# Patient Record
Sex: Female | Born: 1980 | Race: Black or African American | Hispanic: Yes | Marital: Married | Smoking: Never smoker
Health system: Southern US, Community
[De-identification: ages and names within clinical notes are randomized; demographics above are authoritative.]

## PROBLEM LIST (undated history)

## (undated) ENCOUNTER — Inpatient Hospital Stay (HOSPITAL_COMMUNITY): Payer: Self-pay

## (undated) DIAGNOSIS — N883 Incompetence of cervix uteri: Secondary | ICD-10-CM

## (undated) HISTORY — PX: NO PAST SURGERIES: SHX2092

---

## 2014-10-09 NOTE — L&D Delivery Note (Signed)
Delivery Note At 6:40 PM a viable female was delivered via ROA Presentation Placenta status:spontaneously with 3 vessel cord, .  Cord:  with the following complications:none .    Anesthesia:  epidural Episiotomy:  none Lacerations:  none Suture Repair: not applicable Est. Blood Loss (mL): 300   Mom to postpartum.  Baby to NICU.  [name] 02/24/2015, 6:49 PM

## 2014-10-26 ENCOUNTER — Inpatient Hospital Stay (HOSPITAL_COMMUNITY)
Admission: AD | Admit: 2014-10-26 | Discharge: 2014-10-26 | Disposition: A | Discharge status: Home / Self Care | Payer: Managed Care, Other (non HMO) | Source: Ambulatory Visit | Attending: Obstetrics and Gynecology | Admitting: Obstetrics and Gynecology

## 2014-10-26 ENCOUNTER — Encounter (HOSPITAL_COMMUNITY): Payer: Self-pay | Admitting: *Deleted

## 2014-10-26 ENCOUNTER — Inpatient Hospital Stay (HOSPITAL_COMMUNITY): Payer: Managed Care, Other (non HMO)

## 2014-10-26 DIAGNOSIS — Z3A12 12 weeks gestation of pregnancy: Secondary | ICD-10-CM | POA: Diagnosis not present

## 2014-10-26 DIAGNOSIS — O209 Hemorrhage in early pregnancy, unspecified: Secondary | ICD-10-CM | POA: Diagnosis present

## 2014-10-26 DIAGNOSIS — O4691 Antepartum hemorrhage, unspecified, first trimester: Secondary | ICD-10-CM

## 2014-10-26 LAB — CBC
HEMATOCRIT: 32 % — AB (ref 36.0–46.0)
HEMOGLOBIN: 10.9 g/dL — AB (ref 12.0–15.0)
MCH: 26.5 pg (ref 26.0–34.0)
MCHC: 34.1 g/dL (ref 30.0–36.0)
MCV: 77.9 fL — ABNORMAL LOW (ref 78.0–100.0)
Platelets: 340 10*3/uL (ref 150–400)
RBC: 4.11 MIL/uL (ref 3.87–5.11)
RDW: 13.8 % (ref 11.5–15.5)
WBC: 10.6 10*3/uL — AB (ref 4.0–10.5)

## 2014-10-26 LAB — URINE MICROSCOPIC-ADD ON

## 2014-10-26 LAB — URINALYSIS, ROUTINE W REFLEX MICROSCOPIC
Bilirubin Urine: NEGATIVE
GLUCOSE, UA: NEGATIVE mg/dL
Ketones, ur: NEGATIVE mg/dL
Leukocytes, UA: NEGATIVE
Nitrite: NEGATIVE
PROTEIN: NEGATIVE mg/dL
Urobilinogen, UA: 0.2 mg/dL (ref 0.0–1.0)
pH: 5.5 (ref 5.0–8.0)

## 2014-10-26 NOTE — MAU Provider Note (Signed)
History     CSN: [id number]  Arrival date and time: 10/26/14 1950   First Provider Initiated Contact with Patient 10/26/14 2037      Chief Complaint  Patient presents with  . Abdominal Pain  . Vaginal Bleeding   HPI  Erika Manning is a 34 y.o. G1P0 a [redacted]w[redacted]d who presents today with vaginal bleeding. She states that around 1730 she had a gush of blood, and some clots. However, the bleeding has stopped now. She states that after the bleeding started she had some cramping, but she is not having any pain at this time. She denies any recent intercourse. She has had one ultrasound in the office very early in pregnancy, and states that she has an appointment for an [place] on 11/05/14.  History reviewed. No pertinent past medical history.  History reviewed. No pertinent past surgical history.  History reviewed. No pertinent family history.  History  Substance Use Topics  . Smoking status: Never Smoker   . Smokeless tobacco: Not on file  . Alcohol Use: No    Allergies: No Known Allergies  Prescriptions prior to admission  Medication Sig Dispense Refill Last Dose  . acetaminophen (TYLENOL) 325 MG tablet Take 650 mg by mouth every 6 (six) hours as needed for headache.   10/26/2014 at Unknown time  . lactase (LACTAID) 3000 UNITS tablet Take 3,000 Units by mouth as needed (Based on meals.).   10/25/2014 at Unknown time  . Prenatal Vit-Fe Fumarate-FA (PRENATAL MULTIVITAMIN) TABS tablet Take 1 tablet by mouth at bedtime.   10/25/2014 at Unknown time  . ranitidine (ZANTAC) 150 MG tablet Take 150 mg by mouth daily.   10/26/2014 at Unknown time    ROS Physical Exam   Blood pressure 129/76, pulse 91, temperature 98.8 F (37.1 C), temperature source Oral, resp. rate 18, height  (1.676 m), weight 113.399 kg (250 lb), SpO2 99 %.  Physical Exam  Nursing note and vitals reviewed. Constitutional: She is oriented to person, place, and time. She appears well-developed and well-nourished. No  distress.  Cardiovascular: Normal rate.   Respiratory: Effort normal.  GI: Soft. There is no tenderness. There is no rebound.  Genitourinary:   External: no lesion Vagina: small amount of blood seen  Cervix: pink, smooth, no CMT Uterus: 11 weeks size, +FHT with doppler    Neurological: She is alert and oriented to person, place, and time.  Skin: Skin is warm and dry.  Psychiatric: She has a normal mood and affect.    MAU Course  Procedures Results for orders placed or performed during the hospital encounter of 10/26/14 (from the past 24 hour(s))  Urinalysis, Routine w reflex microscopic     Status: Abnormal   Collection Time: 10/26/14  8:05 PM  Result Value Ref Range   Color, Urine YELLOW YELLOW   APPearance CLEAR CLEAR   Specific Gravity, Urine >1.030 (H) 1.005 - 1.030   pH 5.5 5.0 - 8.0   Glucose, UA NEGATIVE NEGATIVE mg/dL   Hgb urine dipstick LARGE (A) NEGATIVE   Bilirubin Urine NEGATIVE NEGATIVE   Ketones, ur NEGATIVE NEGATIVE mg/dL   Protein, ur NEGATIVE NEGATIVE mg/dL   Urobilinogen, UA 0.2 0.0 - 1.0 mg/dL   Nitrite NEGATIVE NEGATIVE   Leukocytes, UA NEGATIVE NEGATIVE  Urine microscopic-add on     Status: Abnormal   Collection Time: 10/26/14  8:05 PM  Result Value Ref Range   Squamous Epithelial / LPF MANY (A) RARE   WBC, UA 0-2 <3 WBC/hpf  RBC / HPF 7-10 <3 RBC/hpf   Bacteria, UA FEW (A) RARE  CBC     Status: Abnormal   Collection Time: 10/26/14 10:02 PM  Result Value Ref Range   WBC 10.6 (H) 4.0 - 10.5 K/uL   RBC 4.11 3.87 - 5.11 MIL/uL   Hemoglobin 10.9 (L) 12.0 - 15.0 g/dL   HCT 40.932.0 (L) 81.136.0 - 91.446.0 %   MCV 77.9 (L) 78.0 - 100.0 fL   MCH 26.5 26.0 - 34.0 pg   MCHC 34.1 30.0 - 36.0 g/dL   RDW 78.213.8 95.611.5 - 21.315.5 %   Platelets 340 150 - 400 K/uL  ABO/Rh     Status: None (Preliminary result)   Collection Time: 10/26/14 10:02 PM  Result Value Ref Range   ABO/RH(D) O POS     2153: D/W Dr. Arelia SneddonMcComb OK for dc home after ABO/RH is done.  Assessment and Plan    1. Vaginal bleeding in pregnancy, first trimester    Bleeding precautions  Return to MAU as needed Follow-up Information    Follow up with MORRIS, MEGAN, DO.   Specialty:  Obstetrics and Gynecology   Why:  As scheduled   Contact information:   [address], [address], [address] [place] [address] [phone]        Tawnya CrookHogan, Heather Donovan 10/26/2014, 10:31 PM

## 2014-10-26 NOTE — MAU Note (Signed)
Unable to sign Esign in room, computer frozen

## 2014-10-26 NOTE — MAU Note (Signed)
Pt reports she had some vaginal bleeding at about 1730, states the bleeding has slowed down some but she is passing clots. Also reports lower abd cramping since 1830. Had an u/s in the office .

## 2014-10-26 NOTE — Discharge Instructions (Signed)
First Trimester of Pregnancy The first trimester of pregnancy is from week 1 until the end of week 12 (months 1 through 3). A week after a sperm fertilizes an egg, the egg will implant on the wall of the uterus. This embryo will begin to develop into a baby. Genes from you and your partner are forming the baby. The female genes determine whether the baby is a boy or a girl. At 6-8 weeks, the eyes and face are formed, and the heartbeat can be seen on ultrasound. At the end of 12 weeks, all the baby's organs are formed.  Now that you are pregnant, you will want to do everything you can to have a healthy baby. Two of the most important things are to get good prenatal care and to follow your health care provider's instructions. Prenatal care is all the medical care you receive before the baby's birth. This care will help prevent, find, and treat any problems during the pregnancy and childbirth. BODY CHANGES Your body goes through many changes during pregnancy. The changes vary from woman to woman.   You may gain or lose a couple of pounds at first.  You may feel sick to your stomach (nauseous) and throw up (vomit). If the vomiting is uncontrollable, call your health care provider.  You may tire easily.  You may develop headaches that can be relieved by medicines approved by your health care provider.  You may urinate more often. Painful urination may mean you have a bladder infection.  You may develop heartburn as a result of your pregnancy.  You may develop constipation because certain hormones are causing the muscles that push waste through your intestines to slow down.  You may develop hemorrhoids or swollen, bulging veins (varicose veins).  Your breasts may begin to grow larger and become tender. Your nipples may stick out more, and the tissue that surrounds them (areola) may become darker.  Your gums may bleed and may be sensitive to brushing and flossing.  Dark spots or blotches (chloasma,  mask of pregnancy) may develop on your face. This will likely fade after the baby is born.  Your menstrual periods will stop.  You may have a loss of appetite.  You may develop cravings for certain kinds of food.  You may have changes in your emotions from day to day, such as being excited to be pregnant or being concerned that something may go wrong with the pregnancy and baby.  You may have more vivid and strange dreams.  You may have changes in your hair. These can include thickening of your hair, rapid growth, and changes in texture. Some women also have hair loss during or after pregnancy, or hair that feels dry or thin. Your hair will most likely return to normal after your baby is born. WHAT TO EXPECT AT YOUR PRENATAL VISITS During a routine prenatal visit:  You will be weighed to make sure you and the baby are growing normally.  Your blood pressure will be taken.  Your abdomen will be measured to track your baby's growth.  The fetal heartbeat will be listened to starting around week 10 or 12 of your pregnancy.  Test results from any previous visits will be discussed. Your health care provider may ask you:  How you are feeling.  If you are feeling the baby move.  If you have had any abnormal symptoms, such as leaking fluid, bleeding, severe headaches, or abdominal cramping.  If you have any questions. Other tests   that may be performed during your first trimester include:  Blood tests to find your blood type and to check for the presence of any previous infections. They will also be used to check for low iron levels (anemia) and Rh antibodies. Later in the pregnancy, blood tests for diabetes will be done along with other tests if problems develop.  Urine tests to check for infections, diabetes, or protein in the urine.  An ultrasound to confirm the proper growth and development of the baby.  An amniocentesis to check for possible genetic problems.  Fetal screens for  spina bifida and Down syndrome.  You may need other tests to make sure you and the baby are doing well. HOME CARE INSTRUCTIONS  Medicines  Follow your health care provider's instructions regarding medicine use. Specific medicines may be either safe or unsafe to take during pregnancy.  Take your prenatal vitamins as directed.  If you develop constipation, try taking a stool softener if your health care provider approves. Diet  Eat regular, well-balanced meals. Choose a variety of foods, such as meat or vegetable-based protein, fish, milk and low-fat dairy products, vegetables, fruits, and whole grain breads and cereals. Your health care provider will help you determine the amount of weight gain that is right for you.  Avoid raw meat and uncooked cheese. These carry germs that can cause birth defects in the baby.  Eating four or five small meals rather than three large meals a day may help relieve nausea and vomiting. If you start to feel nauseous, eating a few soda crackers can be helpful. Drinking liquids between meals instead of during meals also seems to help nausea and vomiting.  If you develop constipation, eat more high-fiber foods, such as fresh vegetables or fruit and whole grains. Drink enough fluids to keep your urine clear or pale yellow. Activity and Exercise  Exercise only as directed by your health care provider. Exercising will help you:  Control your weight.  Stay in shape.  Be prepared for labor and delivery.  Experiencing pain or cramping in the lower abdomen or low back is a good sign that you should stop exercising. Check with your health care provider before continuing normal exercises.  Try to avoid standing for long periods of time. Move your legs often if you must stand in one place for a long time.  Avoid heavy lifting.  Wear low-heeled shoes, and practice good posture.  You may continue to have sex unless your health care provider directs you  otherwise. Relief of Pain or Discomfort  Wear a good support bra for breast tenderness.   Take warm sitz baths to soothe any pain or discomfort caused by hemorrhoids. Use hemorrhoid cream if your health care provider approves.   Rest with your legs elevated if you have leg cramps or low back pain.  If you develop varicose veins in your legs, wear support hose. Elevate your feet for 15 minutes, 3-4 times a day. Limit salt in your diet. Prenatal Care  Schedule your prenatal visits by the twelfth week of pregnancy. They are usually scheduled monthly at first, then more often in the last 2 months before delivery.  Write down your questions. Take them to your prenatal visits.  Keep all your prenatal visits as directed by your health care provider. Safety  Wear your seat belt at all times when driving.  Make a list of emergency phone numbers, including numbers for family, friends, the hospital, and police and fire departments. General Tips    Ask your health care provider for a referral to a local prenatal education class. Begin classes no later than at the beginning of month 6 of your pregnancy.  Ask for help if you have counseling or nutritional needs during pregnancy. Your health care provider can offer advice or refer you to specialists for help with various needs.  Do not use hot tubs, steam rooms, or saunas.  Do not douche or use tampons or scented sanitary pads.  Do not cross your legs for long periods of time.  Avoid cat litter boxes and soil used by cats. These carry germs that can cause birth defects in the baby and possibly loss of the fetus by miscarriage or stillbirth.  Avoid all smoking, herbs, alcohol, and medicines not prescribed by your health care provider. Chemicals in these affect the formation and growth of the baby.  Schedule a dentist appointment. At home, brush your teeth with a soft toothbrush and be gentle when you floss. SEEK MEDICAL CARE IF:   You have  dizziness.  You have mild pelvic cramps, pelvic pressure, or nagging pain in the abdominal area.  You have persistent nausea, vomiting, or diarrhea.  You have a bad smelling vaginal discharge.  You have pain with urination.  You notice increased swelling in your face, hands, legs, or ankles. SEEK IMMEDIATE MEDICAL CARE IF:   You have a fever.  You are leaking fluid from your vagina.  You have spotting or bleeding from your vagina.  You have severe abdominal cramping or pain.  You have rapid weight gain or loss.  You vomit blood or material that looks like coffee grounds.  You are exposed to German measles and have never had them.  You are exposed to fifth disease or chickenpox.  You develop a severe headache.  You have shortness of breath.  You have any kind of trauma, such as from a fall or a car accident. Document Released: 09/19/2001 Document Revised: 02/09/2014 Document Reviewed: 08/05/2013 ExitCare Patient Information 2015 [employer]. This information is not intended to replace advice given to you by your health care provider. Make sure you discuss any questions you have with your health care provider.  

## 2014-10-27 LAB — ABO/RH: ABO/RH(D): O POS

## 2015-01-16 ENCOUNTER — Encounter (HOSPITAL_COMMUNITY): Payer: Self-pay | Admitting: *Deleted

## 2015-01-16 ENCOUNTER — Inpatient Hospital Stay (HOSPITAL_COMMUNITY): Payer: Managed Care, Other (non HMO)

## 2015-01-16 ENCOUNTER — Inpatient Hospital Stay (HOSPITAL_COMMUNITY)
Admission: AD | Admit: 2015-01-16 | Discharge: 2015-02-26 | DRG: 775 | Disposition: A | Discharge status: Home / Self Care | Payer: Managed Care, Other (non HMO) | Source: Ambulatory Visit | Attending: Obstetrics and Gynecology | Admitting: Obstetrics and Gynecology

## 2015-01-16 DIAGNOSIS — Z3A23 23 weeks gestation of pregnancy: Secondary | ICD-10-CM | POA: Diagnosis present

## 2015-01-16 DIAGNOSIS — O24420 Gestational diabetes mellitus in childbirth, diet controlled: Secondary | ICD-10-CM | POA: Diagnosis not present

## 2015-01-16 DIAGNOSIS — O4290 Premature rupture of membranes, unspecified as to length of time between rupture and onset of labor, unspecified weeks of gestation: Secondary | ICD-10-CM | POA: Insufficient documentation

## 2015-01-16 DIAGNOSIS — Z23 Encounter for immunization: Secondary | ICD-10-CM

## 2015-01-16 DIAGNOSIS — Z3A24 24 weeks gestation of pregnancy: Secondary | ICD-10-CM | POA: Diagnosis not present

## 2015-01-16 DIAGNOSIS — Z36 Encounter for antenatal screening of mother: Secondary | ICD-10-CM | POA: Diagnosis present

## 2015-01-16 DIAGNOSIS — E669 Obesity, unspecified: Secondary | ICD-10-CM | POA: Diagnosis not present

## 2015-01-16 DIAGNOSIS — O99210 Obesity complicating pregnancy, unspecified trimester: Secondary | ICD-10-CM

## 2015-01-16 DIAGNOSIS — O42912 Preterm premature rupture of membranes, unspecified as to length of time between rupture and onset of labor, second trimester: Secondary | ICD-10-CM | POA: Diagnosis present

## 2015-01-16 DIAGNOSIS — O99212 Obesity complicating pregnancy, second trimester: Secondary | ICD-10-CM | POA: Diagnosis not present

## 2015-01-16 DIAGNOSIS — O42919 Preterm premature rupture of membranes, unspecified as to length of time between rupture and onset of labor, unspecified trimester: Secondary | ICD-10-CM | POA: Insufficient documentation

## 2015-01-16 DIAGNOSIS — Z3A27 27 weeks gestation of pregnancy: Secondary | ICD-10-CM | POA: Insufficient documentation

## 2015-01-16 DIAGNOSIS — O4100X0 Oligohydramnios, unspecified trimester, not applicable or unspecified: Secondary | ICD-10-CM | POA: Insufficient documentation

## 2015-01-16 LAB — COMPREHENSIVE METABOLIC PANEL
ALK PHOS: 74 U/L (ref 39–117)
ALT: 12 U/L (ref 0–35)
AST: 15 U/L (ref 0–37)
Albumin: 3.4 g/dL — ABNORMAL LOW (ref 3.5–5.2)
Anion gap: 6 (ref 5–15)
BUN: 5 mg/dL — ABNORMAL LOW (ref 6–23)
CALCIUM: 9.1 mg/dL (ref 8.4–10.5)
CO2: 22 mmol/L (ref 19–32)
CREATININE: 0.51 mg/dL (ref 0.50–1.10)
Chloride: 107 mmol/L (ref 96–112)
GFR calc Af Amer: >90 mL/min (ref >90)
Glucose, Bld: 95 mg/dL (ref 70–99)
Potassium: 3.7 mmol/L (ref 3.5–5.1)
Sodium: 135 mmol/L (ref 135–145)
Total Bilirubin: 0.5 mg/dL (ref 0.3–1.2)
Total Protein: 6.5 g/dL (ref 6.0–8.3)

## 2015-01-16 LAB — URINALYSIS, ROUTINE W REFLEX MICROSCOPIC
Bilirubin Urine: NEGATIVE
GLUCOSE, UA: NEGATIVE mg/dL
KETONES UR: 15 mg/dL — AB
LEUKOCYTES UA: NEGATIVE
Nitrite: NEGATIVE
Protein, ur: NEGATIVE mg/dL
Specific Gravity, Urine: >1.03 — ABNORMAL HIGH (ref 1.005–1.030)
Urobilinogen, UA: 0.2 mg/dL (ref 0.0–1.0)
pH: 5.5 (ref 5.0–8.0)

## 2015-01-16 LAB — CBC
HEMATOCRIT: 30.1 % — AB (ref 36.0–46.0)
Hemoglobin: 10.1 g/dL — ABNORMAL LOW (ref 12.0–15.0)
MCH: 26.2 pg (ref 26.0–34.0)
MCHC: 33.6 g/dL (ref 30.0–36.0)
MCV: 78.2 fL (ref 78.0–100.0)
Platelets: 293 10*3/uL (ref 150–400)
RBC: 3.85 MIL/uL — AB (ref 3.87–5.11)
RDW: 13.9 % (ref 11.5–15.5)
WBC: 11 10*3/uL — AB (ref 4.0–10.5)

## 2015-01-16 LAB — URINE MICROSCOPIC-ADD ON

## 2015-01-16 LAB — OB RESULTS CONSOLE GBS: STREP GROUP B AG: NEGATIVE

## 2015-01-16 LAB — GROUP B STREP BY PCR: GROUP B STREP BY PCR: NEGATIVE

## 2015-01-16 LAB — URIC ACID: Uric Acid, Serum: 2.8 mg/dL (ref 2.4–7.0)

## 2015-01-16 LAB — TYPE AND SCREEN
ABO/RH(D): O POS
Antibody Screen: NEGATIVE

## 2015-01-16 LAB — AMNISURE RUPTURE OF MEMBRANE (ROM) NOT AT ARMC: Amnisure ROM: POSITIVE

## 2015-01-16 MED ORDER — ZOLPIDEM TARTRATE 5 MG PO TABS: 5.0000 mg | ORAL_TABLET | Freq: Every evening | ORAL | Status: DC | PRN

## 2015-01-16 MED ORDER — SODIUM CHLORIDE 0.9 % IV SOLN: 250.0000 mL | INTRAVENOUS | Status: DC | PRN

## 2015-01-16 MED ORDER — CALCIUM CARBONATE ANTACID 500 MG PO CHEW: 2.0000 | CHEWABLE_TABLET | ORAL | Status: DC | PRN

## 2015-01-16 MED ORDER — SODIUM CHLORIDE 0.9 % IJ SOLN
3.0000 mL | Freq: Two times a day (BID) | INTRAMUSCULAR | Status: DC
  Administered 2015-01-16 – 2015-02-01 (×31): 3 mL via INTRAVENOUS

## 2015-01-16 MED ORDER — AZITHROMYCIN 250 MG PO TABS
500.0000 mg | ORAL_TABLET | Freq: Every day | ORAL | Status: AC
  Administered 2015-01-18 – 2015-01-22 (×5): 500 mg via ORAL
  Filled 2015-01-16 (×6): qty 2

## 2015-01-16 MED ORDER — ACETAMINOPHEN 325 MG PO TABS
650.0000 mg | ORAL_TABLET | ORAL | Status: DC | PRN
  Administered 2015-02-07: 650 mg via ORAL
  Filled 2015-01-16: qty 2

## 2015-01-16 MED ORDER — PRENATAL MULTIVITAMIN CH
1.0000 | ORAL_TABLET | Freq: Every day | ORAL | Status: DC
  Administered 2015-01-16 – 2015-02-24 (×40): 1 via ORAL
  Filled 2015-01-16 (×41): qty 1

## 2015-01-16 MED ORDER — SODIUM CHLORIDE 0.9 % IV SOLN
Freq: Once | INTRAVENOUS | Status: AC
  Administered 2015-01-16: 13:00:00 via INTRAVENOUS

## 2015-01-16 MED ORDER — AMOXICILLIN 500 MG PO CAPS
500.0000 mg | ORAL_CAPSULE | Freq: Three times a day (TID) | ORAL | Status: AC
  Administered 2015-01-18 – 2015-01-23 (×15): 500 mg via ORAL
  Filled 2015-01-16 (×15): qty 1

## 2015-01-16 MED ORDER — FAMOTIDINE 20 MG PO TABS
10.0000 mg | ORAL_TABLET | Freq: Every day | ORAL | Status: DC
  Filled 2015-01-16: qty 1

## 2015-01-16 MED ORDER — BETAMETHASONE SOD PHOS & ACET 6 (3-3) MG/ML IJ SUSP
12.0000 mg | INTRAMUSCULAR | Status: AC
  Administered 2015-01-16 – 2015-01-17 (×2): 12 mg via INTRAMUSCULAR
  Filled 2015-01-16 (×2): qty 2

## 2015-01-16 MED ORDER — DEXTROSE 5 % IV SOLN
500.0000 mg | INTRAVENOUS | Status: AC
  Administered 2015-01-16 – 2015-01-17 (×2): 500 mg via INTRAVENOUS
  Filled 2015-01-16 (×2): qty 500

## 2015-01-16 MED ORDER — SODIUM CHLORIDE 0.9 % IJ SOLN
3.0000 mL | INTRAMUSCULAR | Status: DC | PRN
  Administered 2015-01-16 – 2015-01-29 (×5): 3 mL via INTRAVENOUS
  Filled 2015-01-16 (×5): qty 3

## 2015-01-16 MED ORDER — AMPICILLIN SODIUM 2 G IJ SOLR
2.0000 g | Freq: Four times a day (QID) | INTRAMUSCULAR | Status: AC
  Administered 2015-01-16 – 2015-01-18 (×8): 2 g via INTRAVENOUS
  Filled 2015-01-16 (×8): qty 2000

## 2015-01-16 MED ORDER — DOCUSATE SODIUM 100 MG PO CAPS
100.0000 mg | ORAL_CAPSULE | Freq: Every day | ORAL | Status: DC
  Administered 2015-01-17 – 2015-02-26 (×41): 100 mg via ORAL
  Filled 2015-01-16 (×41): qty 1

## 2015-01-16 NOTE — MAU Provider Note (Signed)
History     CSN: [id number]  Arrival date and time: 01/16/15 0844   First Provider Initiated Contact with Patient 01/16/15 [phone]      Chief Complaint  Patient presents with  . Leaking Fluid    HPI   Ms. Erika Manning is a 34 y.o. female G1P0 at [redacted]w[redacted]d who presents with possible ROM. She got up to the bathroom this morning around 0730 and after getting back into bed she felt a gush of fluid from her vagina. She has not continued to leak fluid after the initial gush. She describes the fluid as clear, non-odorous. She currently denies pain.   She denies recent intercourse.   OB History    Gravida Para Term Preterm AB TAB SAB Ectopic Multiple Living   1               History reviewed. No pertinent past medical history.  History reviewed. No pertinent past surgical history.  History reviewed. No pertinent family history.  History  Substance Use Topics  . Smoking status: Never Smoker   . Smokeless tobacco: Never Used  . Alcohol Use: No    Allergies: No Known Allergies  Prescriptions prior to admission  Medication Sig Dispense Refill Last Dose  . acetaminophen (TYLENOL) 325 MG tablet Take 650 mg by mouth every 6 (six) hours as needed for headache.   10/26/2014 at Unknown time  . lactase (LACTAID) 3000 UNITS tablet Take 3,000 Units by mouth as needed (Based on meals.).   10/25/2014 at Unknown time  . Prenatal Vit-Fe Fumarate-FA (PRENATAL MULTIVITAMIN) TABS tablet Take 1 tablet by mouth at bedtime.   10/25/2014 at Unknown time  . ranitidine (ZANTAC) 150 MG tablet Take 150 mg by mouth daily.   10/26/2014 at Unknown time   Results for orders placed or performed during the hospital encounter of 01/16/15 (from the past 48 hour(s))  Urinalysis, Routine w reflex microscopic     Status: Abnormal   Collection Time: 01/16/15  8:50 AM  Result Value Ref Range   Color, Urine YELLOW YELLOW   APPearance CLEAR CLEAR   Specific Gravity, Urine >1.030 (H) 1.005 - 1.030   pH 5.5 5.0 - 8.0   Glucose, UA NEGATIVE NEGATIVE mg/dL   Hgb urine dipstick TRACE (A) NEGATIVE   Bilirubin Urine NEGATIVE NEGATIVE   Ketones, ur 15 (A) NEGATIVE mg/dL   Protein, ur NEGATIVE NEGATIVE mg/dL   Urobilinogen, UA 0.2 0.0 - 1.0 mg/dL   Nitrite NEGATIVE NEGATIVE   Leukocytes, UA NEGATIVE NEGATIVE  Urine microscopic-add on     Status: Abnormal   Collection Time: 01/16/15  8:50 AM  Result Value Ref Range   Squamous Epithelial / LPF FEW (A) RARE   WBC, UA 0-2 <3 WBC/hpf   RBC / HPF 3-6 <3 RBC/hpf   Bacteria, UA RARE RARE  Amnisure rupture of membrane (rom)     Status: None   Collection Time: 01/16/15  9:15 AM  Result Value Ref Range   Amnisure ROM POSITIVE     Review of Systems  Constitutional: Negative for fever.  Gastrointestinal: Negative for nausea, vomiting and abdominal pain.   Physical Exam   Blood pressure 117/64, pulse 96, temperature 98.3 F (36.8 C), resp. rate 18, height  (1.676 m), weight 110.904 kg (244 lb 8 oz).  Physical Exam  Constitutional: She is oriented to person, place, and time. She appears well-developed and well-nourished. No distress.  HENT:  Head: Normocephalic.  Eyes: Pupils are equal, round, and reactive to  light.  Neck: Neck supple.  Respiratory: Effort normal.  GI: Soft. She exhibits no distension. There is no tenderness. There is no rebound and no guarding.  Genitourinary:  Speculum exam: Vagina - Small amount of mucus like, clear discharge. No pooling of fluid.  Bimanual exam: deferred  Crist FatFern and amnisure collected  Chaperone present for exam.  Musculoskeletal: Normal range of motion.  Neurological: She is alert and oriented to person, place, and time.  Skin: Skin is warm. She is not diaphoretic.    Fetal Tracing: Baseline: 130 bpm  Variability: moderate  Accelerations: 10x10 Decelerations: none Toco: Quiet    MAU Course  Procedures  None  MDM amnisure positive Fern slide negative  Dr. Henderson Cloudomblin informed of amnisure results. Dr.  Henderson Cloudomblin to come to MAU to discuss plan of care with the patient.   Assessment and Plan   A:  PROM at 1089w4d Positive amnisure.    Duane LopeJennifer I Rasch, NP 01/16/2015 10:07 AM

## 2015-01-16 NOTE — H&P (Signed)
Madelyn FlavorsJennifer Guardia is a 34 y.o. female presenting for C/O leaking fluid this am about 15 minutes after voiding and then recurred enough to soak underwear and run down leg. No bleeding, no fever, no rash, no pain or cramping. Maternal Medical History:  Reason for admission: Rupture of membranes.   Fetal activity: Perceived fetal activity is normal.      OB History    Gravida Para Term Preterm AB TAB SAB Ectopic Multiple Living   3 0 0 0 2 2 0 0 0 0      History reviewed. No pertinent past medical history. History reviewed. No pertinent past surgical history. Family History: family history is not on file. Social History:  reports that she has never smoked. She has never used smokeless tobacco. She reports that she does not drink alcohol or use illicit drugs.   Prenatal Transfer Tool  Maternal Diabetes: No Genetic Screening: Normal Maternal Ultrasounds/Referrals: Normal Fetal Ultrasounds or other Referrals:  None Maternal Substance Abuse:  No Significant Maternal Medications:  None Significant Maternal Lab Results:  None Other Comments:  None  Review of Systems  Constitutional: Negative for fever.  Eyes: Negative for blurred vision.  Gastrointestinal: Negative for abdominal pain.  Neurological: Negative for headaches.      Blood pressure 117/64, pulse 96, temperature 98.3 F (36.8 C), resp. rate 18, height 5\' 6"  (1.676 m), weight 244 lb 8 oz (110.904 kg). Maternal Exam:  Abdomen: Patient reports no abdominal tenderness.   Physical Exam  Cardiovascular: Normal rate and regular rhythm.   Respiratory: Effort normal and breath sounds normal.  GI: Soft. There is no tenderness.  Genitourinary:  Uterus soft and NT  Neurological: She has normal reflexes.    SSE with extender is + Amniosure and negative fern SSE with me- no pool, negative fern  Prenatal labs: ABO, Rh: --/--/O POS (01/18 2202) Antibody:   Rubella:   RPR:    HBsAg:    HIV:    GBS:     Assessment/Plan: 34  yo G3P0 with probable PPROM    D/W patient that her history is very suggestive of PPROM and Amniosure is positive. Fern and pool negative x 2. However, could be intermittent leak. Will treat now as PPROM and observe.    U/S    Antenatal bed with BR and BRP    Check labs    Prophylactic ATB, BMTZ    Neonatology admission    Observe    Furman Trentman II,Kori Goins E 01/16/2015, 10:26 AM

## 2015-01-16 NOTE — MAU Note (Signed)
Pt presents with complaints of leakage of fluid since this morning after 7. Denies any abdominal cramping or vaginal bleeding. No complications with the pregnancy.

## 2015-01-16 NOTE — Consult Note (Signed)
[facility] 01/16/2015    3:25 PM  Neonatal Medicine Consultation         Erika FlavorsJennifer Manning          MRN:  [id number]  I was called at the request of the patient's obstetrician (Dr.Tomblin) to speak to this patient due to suspected PROM at 23 4/7 weeks.  The patient's prenatal course has been uneventful until this morning, when she had possible premature rupture of membranes.  She is 23 4/7 weeks currently.  She is admitted to antenatal unit, and is receiving treatment that includes legacy antibiotics and betamethasone course.  The baby is a female.  I reviewed expectations for a baby born at 23-25 weeks, including survival, length of stay, morbidities such as respiratory distress, IVH, infection, feeding intolerance, retinopathy.  I described how we provide respiratory and feeding support.  Mom plans to breast feed, which I encouraged as best for the baby (with supplementations for needed calories).  I let mom know that the baby's outlook generally improves the longer she remains undelivered.  I spent 30 minutes reviewing the record, speaking to the patient, and entering appropriate documentation.  More than 50% of the time was spent face to face with patient.   _____________________ Electronically Signed By: Angelita InglesMcCrae S. Selisa Tensley, MD Neonatologist

## 2015-01-16 NOTE — MAU Note (Signed)
Pt states went to restroom and voided, and laid back down. Felt gush of fluid and went to restroom again and more fluid came out. Also had another gush of fluid 20 minutes ago. Very mild cramping per pt at present, otherwise has not felt pain.

## 2015-01-17 MED ORDER — FAMOTIDINE 20 MG PO TABS
20.0000 mg | ORAL_TABLET | Freq: Every day | ORAL | Status: DC
  Administered 2015-01-17 – 2015-02-24 (×39): 20 mg via ORAL
  Filled 2015-01-17 (×38): qty 1

## 2015-01-17 NOTE — Progress Notes (Signed)
23 5/7 weeks  One instance of either leak of fluid or loss of bladder when up to BR. No other leaking or bleeding. No cramping  VSS Afeb  Uterus soft, NT  FHT + UCs none tracing yet  U/S yesterday= vtx, oligohydramnios  A/P: PPROM        D/W patient MBR, observation, ATBs ordered, BMTZ       Neonatology consult done       U/S 2 days to check AFI

## 2015-01-18 NOTE — Progress Notes (Signed)
No current c/o.  Rare leakage of clear fluid.  No uterine tenderness.  Active FM.  VSS. AF. FHT reactive 4/9 u/s- oligo, VTX, nl cervical length  Gen: NAD Abd: soft, NT Ext: no c/c/e  34yo G3P0 at 5958w6d with PPROM -s/p BMZ -D3 latency abx -s/p NICU consult -Rpt AFI tomorrow -Monitor for symptoms of infection or labor  Mitchel HonourMegan Maddon Horton, DO

## 2015-01-19 ENCOUNTER — Ambulatory Visit (HOSPITAL_COMMUNITY): Payer: Managed Care, Other (non HMO) | Attending: Obstetrics & Gynecology

## 2015-01-19 DIAGNOSIS — Z3A24 24 weeks gestation of pregnancy: Secondary | ICD-10-CM | POA: Insufficient documentation

## 2015-01-19 DIAGNOSIS — O42912 Preterm premature rupture of membranes, unspecified as to length of time between rupture and onset of labor, second trimester: Secondary | ICD-10-CM | POA: Insufficient documentation

## 2015-01-19 DIAGNOSIS — O99212 Obesity complicating pregnancy, second trimester: Secondary | ICD-10-CM | POA: Insufficient documentation

## 2015-01-19 DIAGNOSIS — E669 Obesity, unspecified: Secondary | ICD-10-CM | POA: Insufficient documentation

## 2015-01-19 DIAGNOSIS — Z36 Encounter for antenatal screening of mother: Secondary | ICD-10-CM | POA: Diagnosis not present

## 2015-01-19 LAB — TYPE AND SCREEN
ABO/RH(D): O POS
Antibody Screen: NEGATIVE

## 2015-01-19 NOTE — Discharge Instructions (Signed)
Amniotic Lecithin-Sphingomyelin Ratio Your caregiver has ordered an amniotic L/S ratio. This is a test done on amniotic fluid (the fluid the baby floats in within the uterus). This fluid is removed from your uterus with a needle. This test is done to test the maturity of the baby's lungs. This test helps to determine if the baby will have breathing problems from underdeveloped lungs (hyaline membrane disease, respiratory distress syndrome) if delivery occurs near the time this test is done. It helps to prepare for lung problems in the baby, helps your caregiver know what steps may be needed at the time of delivery, and if it is safe to deliver the baby at this stage in the pregnancy.  If you are Rh-negative you may need to have additional testing following the amniocentesis (the drawing of the amniotic fluid) to make sure you have not been sensitized to your baby's blood. Rhogam may be given following an amniocentesis to make sure sensitization does not occur. Occasionally testing is done prior to giving Rhogam to determine if it is needed. PREPARATION FOR TEST Your caregiver will give you instructions prior to this test. This test can usually be done any time of day on any diet without fasting. Follow the instructions of your caregiver and keep appointments as recommended.  Tell the person doing the test if you have been told of any pregnancy difficulties, such as early labor, a weak cervix, a placenta that is in an unusual position, a placenta that is separating from the wall of the uterus too soon, and if you are Rh-negative.  If ultrasound is used, you may need to drink extra fluids so you have a full bladder for the test. NORMAL FINDINGS  Weeks' Gestation: 15.  Amniotic Fluid volume: 450 mL.  Weeks' Gestation: 25.  Amniotic Fluid volume: 750 mL.  Weeks' Gestation: 30-35.  Amniotic Fluid volume: 1500 mL.  Weeks' Gestation: Full term.  Amniotic Fluid volume: greater than 1500  mL.  Amniotic fluid appearance: clear; pale to straw yellow.  Lecithin/sphingomyelin (L/S) ratio greater than or equal to 2:1.  Bilirubin: less than 0.2 mg/dL.  No chromosomal or genetic abnormalities.  Phosphatidylglycerol (PG): positive for PG.  Lamellar body count greater than 30,000.  Alpha fetoprotein: dependent on gestational age and lab technique.  Fetal lung maturity (FLM).  Mature: less than 260 mPOL.  Transitional: 260-290 mPOL.  Immature: greater than 290 mPOL. Ranges for normal findings may vary among different laboratories and hospitals. You should always check with your doctor after having lab work or other tests done to discuss the meaning of your test results and whether your values are considered within normal limits. MEANING OF TEST  Your caregiver will go over the test results with you and discuss the importance and meaning of your results, as well as treatment options and the need for additional tests if necessary. OBTAINING THE TEST RESULTS  It is your responsibility to obtain your test results. Ask the lab or department performing the test when and how you will get your results. Document Released: 10/17/2004 Document Revised: 02/09/2014 Document Reviewed: 08/31/2008 [facility] Patient Information 2015 [place], [place]. This information is not intended to replace advice given to you by your health care provider. Make sure you discuss any questions you have with your health care provider.

## 2015-01-19 NOTE — Progress Notes (Signed)
Hospital Day # 4 Problems: 1. PPROM - April 9th 2.  Oligohydramnios  S:  Patient is doing well. Only notices scant amount of leakage.  No bleeding. Denies cramping. Denies foul smelling discharge.  Reports good fetal movement.  O:  Afebrile VSS Fetal heart tones are normal General alert and oriented Abdomen is soft and non tender uterus  IMPRESSION:  IUP at 24 weeks PPROM April 9th  PLAN: No evidence of chorioamnionitis Ultrasound today to check AFI Day #4 of latency antibiotics Completed steroid series NICU Consultation performed Recommend magnesium for neuroprotection if labor occurs

## 2015-01-20 NOTE — Progress Notes (Signed)
HD #5   S: Patient is doing well. Only notices scant amount of leakage. No bleeding. Denies cramping. Denies foul smelling discharge. Reports good fetal movement.  O: Afebrile VSS Fetal heart tones are normal General alert and oriented Abdomen is soft and non tender uterus  IMPRESSION:  IUP at 24 +1 weeks PPROM April 9th  PLAN: No evidence of chorioamnionitis Ultrasound today to check AFI Day #5 of latency antibiotics Completed steroid series NICU Consultation performed Recommend magnesium for neuroprotection if labor occurs

## 2015-01-21 NOTE — Progress Notes (Signed)
Antenatal Nutrition Assessment:  Currently  24 2/[redacted] weeks gestation, with PROM. Height  66 "  Weight 242 lbs  pre-pregnancy weight 252 lbs .  Pre-pregnancy  BMI 40.8  IBW 130 lbs Total weight gain 0.lbs Weight gain goals 11-20 lbs Estimated needs: 2300-2400 kcal/day, 100-110 grams protein/day, 2.4 liters fluid/day  Regular diet tolerated well, appetite good. No N/v Current diet prescription will provide for increased needs. May order double protein portions, snacks TID and from retail  No abnormal nutrition related labs  Nutrition Dx: Increased nutrient needs r/t pregnancy and fetal growth requirements aeb [redacted] weeks gestation.  No educational needs assessed at this time.  Elisabeth CaraKatherine Tywaun Hiltner M.Odis LusterEd. R.D. LDN Neonatal Nutrition Support Specialist/RD III Pager [phone] '

## 2015-01-21 NOTE — Progress Notes (Signed)
Ur chart review completed.  

## 2015-01-21 NOTE — Progress Notes (Signed)
Patient ID: Erika FlavorsJennifer Leatherwood, female   DOB: 1981-03-09, 34 y.o.   MRN: [id number] Pt without complaints GFM Clear fluid leaking  VSSAF FHR 140s Cat 1, occas variable occas Ctxs  Abd Gravid, nt Neg homans Bilaterally  IUP at 24 +2 weeks PPROM April 9th  PLAN: No evidence of chorioamnionitis Ultrasound 4/12 AFI 3.9, Vtx Day #6 of latency antibiotics Completed steroid series NICU Consultation performed Recommend magnesium for neuroprotection if labor occurs

## 2015-01-21 NOTE — Plan of Care (Signed)
Problem: Phase I Progression Outcomes Goal: LOS < 4 days Outcome: Not Met (add Reason) Patient here for prolonged hospitalization due to PROM     

## 2015-01-22 LAB — TYPE AND SCREEN
ABO/RH(D): O POS
Antibody Screen: NEGATIVE

## 2015-01-22 NOTE — Progress Notes (Signed)
Patient ID: Erika FlavorsJennifer Manning, female   DOB: Nov 02, 1980, 34 y.o.   MRN: [id number] S: STILL LEAKAGE O: AF VSS      GRAVID UTERUS NONTENDER      FHR CAT ONE A:  IUP AT 24.3 WITH PROM P: EXP MANAGEMENT

## 2015-01-23 NOTE — Progress Notes (Signed)
Patient ID: Erika FlavorsJennifer Manning, female   DOB: December 09, 1980, 34 y.o.   MRN: [id number] S: STILL WITH LEAKAGE O: AF VSS      GRAVID UTERUS NONTENDER      FHR CAT ONE A:  IUP AT 24.4 WITH PPROM P: EXP MANAGEMENT

## 2015-01-23 NOTE — Plan of Care (Signed)
Problem: Phase I Progression Outcomes Goal: Contractions < 5-6/hour Outcome: Progressing No contractions noted on EFM during 01/22/15 at 2155.  Goal: Maintains reassuring Fetal Heart Rate Outcome: Progressing Reassuring FHR.  Problem: Phase II Progression Outcomes Goal: Electronic fetal monitoring(Doppler,Continuous,Intermittent) EFM (Doppler, Continuous, Intermittent)  Outcome: Progressing EFM done for night shift 01/22/15. Goal: Tolerating diet Outcome: Progressing Pt tolerating diet. Goal: Output > 30 ml/hr or voiding qs Outcome: Progressing Urine output adequate.

## 2015-01-24 NOTE — Progress Notes (Signed)
Patient ID: Erika FlavorsJennifer Newlun, female   DOB: 07-26-1981, 34 y.o.   MRN: [id number] S: STILL WITH LEAKAGE O: AF VSS      GRAVID UTERUS NONTENDER      FHR CAT ONE A:  IUP AT 24.5 WITH PPROM P: EXP MANAGEMENT

## 2015-01-25 LAB — TYPE AND SCREEN
ABO/RH(D): O POS
Antibody Screen: NEGATIVE

## 2015-01-25 NOTE — Progress Notes (Signed)
Ur chart review completed.  

## 2015-01-25 NOTE — Progress Notes (Signed)
Some small leaks, clear fluid  VSS Afeb Uterus soft, NT  FHT + accels No UCs  U/S 01/19/15  Vtx, oligo  A/P: PPROM          Leaking continues, no evidence of chorioamnionitis        Vtx        U/S for growth 1-2 weeks        Magnesium Sulfate for neuroprotection if labors        BMTZ done        Expectant management

## 2015-01-26 NOTE — Progress Notes (Signed)
Patient ID: Erika FlavorsJennifer Manning, female   DOB: [birth year], 34 y.o.   MRN: [id number] Pt without complaints GFM Leaking clear  VSSAF FHR 140s + accels Ctxs Rare  Abd Gravid, nt Neg homans bil   IUP at 25 0/7 weeks PPROM April 9th  PLAN: Continue expectant management No evidence of chorioamnionitis Ultrasound 4/12 AFI 3.9, Vtx S/P latency antibiotics Completed steroid series NICU Consultation performed Recommend magnesium for neuroprotection if labor occurs

## 2015-01-27 NOTE — Plan of Care (Signed)
Problem: Phase I Progression Outcomes Goal: LOS < 4 days Outcome: Not Met (add Reason) Patient here for prolonged hospitalization due to PROM

## 2015-01-27 NOTE — Progress Notes (Signed)
394w1d  S// no new complaints, leaking min AF  O/  BP 107/49 mmHg  Pulse 83  Temp(Src) 98.2 F (36.8 C) (Oral)  Resp 18  Ht 5\' 6"  (1.676 m)  Wt 242 lb (109.77 kg)  BMI 39.08 kg/m2  FHR stable  A+P// 144w1d , PPROM>>>  PLAN: Continue expectant management No evidence of chorioamnionitis Ultrasound 4/12 AFI 3.9, Vtx S/P latency antibiotics Completed steroid series NICU Consultation performed Recommend magnesium for neuroprotection if labor occurs

## 2015-01-28 LAB — TYPE AND SCREEN
ABO/RH(D): O POS
ANTIBODY SCREEN: NEGATIVE

## 2015-01-28 NOTE — Progress Notes (Signed)
S:  Patient is doing well. Reports good fetal movement. Denies contractions  O:  BP 116/60 mmHg  Pulse 95  Temp(Src) 97.9 F (36.6 C) (Oral)  Resp 16  Ht 5\' 6"  (1.676 m)  Wt 109.045 kg (240 lb 6.4 oz)  BMI 38.82 kg/m2 No results found for this or any previous visit (from the past 24 hour(s)). General alert and oriented Lung CTAB Car RRR Abdomen is soft and non tender  IMPRESSION:: IUP at 25 w 2 days PPROM PLAN: Continue bedrest Status post antibiotics and steroids Monitor for chorioamnionitis Magnesium for neuroprotection if delivery is imminent

## 2015-01-29 NOTE — Progress Notes (Signed)
No complaints.  Occasional LOF, clear.  No CTX or abdominal pain.  Active FM.  VSS. AF. Gen: A&O x 3 Abd: soft, NT Ext: no c/c/e  34yo G3P0 at 6136w3d with PPROM -s/p BMZ and latency abx -Continue bedrest -Monitor for s/sx chorio -Mag for NP if labor, VTX at last u/s  Mitchel HonourMegan Demarea Lorey, DO

## 2015-01-30 NOTE — Progress Notes (Signed)
No complaints. Occasional LOF, clear. No CTX or abdominal pain. Active FM.  VSS. AF. Gen: A&O x 3 Abd: soft, NT Ext: no c/c/e  34yo G3P0 at 6132w4d with PPROM -s/p BMZ and latency abx -Continue bedrest -Monitor for s/sx chorio -Mag for NP if labor, VTX at last u/s -Plan to do glucola next week  Mitchel HonourMegan Evelyne Makepeace, DO

## 2015-01-31 LAB — TYPE AND SCREEN
ABO/RH(D): O POS
ANTIBODY SCREEN: NEGATIVE

## 2015-01-31 NOTE — Plan of Care (Signed)
Problem: Consults Goal: Birthing Suites Patient Information Press F2 to bring up selections list  Outcome: Not Applicable Date Met:  72/07/21  Patient admitted to antenatal due to PROM.  Problem: Phase I Progression Outcomes Goal: LOS < 4 days Outcome: Not Met (add Reason) Patient on antenatal due to prolonged hospitalization due to PROM

## 2015-01-31 NOTE — Progress Notes (Signed)
No complaints. Occasional LOF, clear. No CTX or abdominal pain. Active FM.  VSS. AF. Gen: A&O x 3 Abd: soft, NT Ext: no c/c/e  34yo G3P0 at 3268w5d with PPROM -s/p BMZ and latency abx -Continue bedrest -Monitor for s/sx chorio -Mag for NP if labor, VTX at last u/s -Plan to do glucola this week  Mitchel HonourMegan Ericka Marcellus, DO

## 2015-02-01 NOTE — Progress Notes (Signed)
Patient ID: Erika FlavorsJennifer Manning, female   DOB: [birth year], 34 y.o.   MRN: [id number] S: STILL WITH SOME LEAKAGE O: AF VSS      GRAVID UTERUS NONTENDER       FHR CAT ONE A: IUP AT 25.6 WITH PPROM P: EXP MANAGEMENT

## 2015-02-02 LAB — GLUCOSE TOLERANCE, 1 HOUR: Glucose, 1 Hour GTT: 170 mg/dL — ABNORMAL HIGH (ref 70–140)

## 2015-02-02 NOTE — Progress Notes (Signed)
Pt comfortable.  Good FM.  + LOF, clear   FHT reassuring Toco quiet  A/P:  PPROM @ 26 wks S/p BMZ & latency abx 1hr glucose today Exp mngt

## 2015-02-02 NOTE — Progress Notes (Signed)
Ur chart review completed.  

## 2015-02-03 LAB — TYPE AND SCREEN
ABO/RH(D): O POS
Antibody Screen: NEGATIVE

## 2015-02-03 NOTE — Progress Notes (Signed)
S:  Patient is doing well. Minimal leaking.  Denies contractions. One hour PG yesterday 170  Scheduled for 3 Hour GTT on Thursday  O:  BP 109/57 mmHg  Pulse 80  Temp(Src) 98.3 F (36.8 C) (Oral)  Resp 18  Ht 5\' 6"  (1.676 m)  Wt 109.045 kg (240 lb 6.4 oz)  BMI 38.82 kg/m2 Results for orders placed or performed during the hospital encounter of 01/16/15 (from the past 24 hour(s))  Glucose tolerance, 1 hour     Status: Abnormal   Collection Time: 02/02/15  9:20 AM  Result Value Ref Range   Glucose, 1 Hour GTT 170 (H) 70 - 140 mg/dL   General alert and oriented Abdomen is soft and non tender  IMPRESSION: IUP at 26 w 1 day PPROM Abnormal one hour PG  PLAN: 3 hour GTT tomorrow Status post antibiotics and steroids Continue in hospital management

## 2015-02-04 LAB — GLUCOSE, 1 HOUR GESTATIONAL: GLUCOSE, 1 HOUR-GESTATIONAL: 213 mg/dL — AB (ref 70–189)

## 2015-02-04 LAB — GLUCOSE, 2 HOUR GESTATIONAL: Glucose Tolerance, 2 hour: 203 mg/dL — ABNORMAL HIGH (ref 70–164)

## 2015-02-04 LAB — GLUCOSE, FASTING GESTATIONAL: GLUCOSE, FASTING-GESTATIONAL: 112 mg/dL

## 2015-02-04 LAB — GLUCOSE, 3 HOUR GESTATIONAL: Glucose, GTT - 3 Hour: 188 mg/dL — ABNORMAL HIGH (ref 70–144)

## 2015-02-04 NOTE — Progress Notes (Signed)
TC to Dr Marcelle OverlieHolland to report results of 3hr GTT.  No orders will see pt in the am.

## 2015-02-04 NOTE — Progress Notes (Signed)
3634w2d  S//  No new complaints, min leakage, good FM  O// BP 114/63 mmHg  Pulse 80  Temp(Src) 98.4 F (36.9 C) (Oral)  Resp 16  Ht 5\' 6"  (1.676 m)  Wt 238 lb 14.4 oz (108.364 kg)  BMI 38.58 kg/m2  FHR stable  3hr GTT today  A+P// 6334w2d , PPROM, s/p ABX + BMZ

## 2015-02-04 NOTE — Plan of Care (Signed)
Problem: Phase II Progression Outcomes Goal: Labs/tests as ordered Labs/tests as ordered (Magnesium level, CBG's, CBC, CMET, 24 hr Urine, Amniocentesis, Ultrasound, Other)  Outcome: Progressing 3 hour GTT done on 02/04/15

## 2015-02-05 LAB — GLUCOSE, CAPILLARY
Glucose-Capillary: 104 mg/dL — ABNORMAL HIGH (ref 70–99)
Glucose-Capillary: 143 mg/dL — ABNORMAL HIGH (ref 70–99)
Glucose-Capillary: 111 mg/dL — ABNORMAL HIGH (ref 70–99)
Glucose-Capillary: 93 mg/dL (ref 70–99)

## 2015-02-05 NOTE — Progress Notes (Signed)
Noted consult for New Gestational Diabetes diagnosis. Patient reports that her maternal mother has diabetes and this is the only family history of diabetes that she is aware of. Spoke with the patient regarding GDM. Explained what GDM is, how GDM can affect her as well as her baby, glycemic goals during GDM, and how GDM is managed. Provided Managing Gestational Diabetes booklet and asked patient to read over the book and let her nursing staff know if she has any questions. Informed patient that fasting and 2H PP glucose will be monitored and if glucose is not within glycemic goals may need to begin insulin. Talked with patient about insulin and reviewed signs and symptoms of hypoglycemia and asked patient to call nurse if she were to get any symptoms of hypoglycemia so that glucose can be checked. Discussed importance of checking glucose 2 hours post prandial and asked patient to keep up with the time she takes her first bite of each meal so that glucose can be check 2 hours after her first bite of meal. Encouraged patient to notify nursing staff for 2 hour post prandial glucose checks. Patient verbalized understanding of information discussed and she states that she has no further questions related to diabetes at this time.  Will continue to follow patient to determine recommendations for inpatient glycemic treatment. However, if glycemic goals (fasting 60-90 mg/dl and/or 2H PP less than 120 mg/dl) are not being met would recommend starting patient on insulin per Diabetic Pregnant Patient order set using weight and gestation chart. Will re-evaluate patient in the morning to determine if insulin is needed. Have ordered CBGs fasting and 2H PP and changed diet to gestational carb modified diet.  Thanks, Barnie Alderman, RN, MSN, CCRN, CDE Diabetes Coordinator Inpatient Diabetes Program [phone] (Team Pager from [place] to [place]) [phone] ([place] office) [phone] [facility] office)

## 2015-02-05 NOTE — Progress Notes (Signed)
Patient ID: Erika FlavorsJennifer Kriesel, female   DOB: [birth year], 34 y.o.   MRN: [id number] Pt without complaints GFM Rare ctxs  VSSAF FHR 140s + accels Ctxs rare  Abd Gravid nt  IMPRESSION: IUP at 26 w 3 days PPROM Abnormal 3h GTT= Gestationnal DM  Plan: Diabetic teaching/ diet / monitoring beginning today Status post antibiotics and steroids Continue in hospital management

## 2015-02-05 NOTE — Plan of Care (Signed)
Problem: Consults Goal: Diabetes Guidelines per MD order/protocol Outcome: Progressing Diabetic consult ordered today.  Goal: Nutrition Consult-if indicated Outcome: Progressing Consult ordered today.

## 2015-02-05 NOTE — Progress Notes (Signed)
  Nutrition Dx: Food and nutrition-related knowledge deficit r/t no previous education aeb newly diagnosed GDM.   Nutrition education consult for Carbohydrate Modified Gestational Diabetic Diet completed.  "Meal  plan for gestational diabetics" handout given to patient.  Basic concepts reviewed. Reinforced serum glucose targets, and  effects to the preterm infant, of elevated glucose levels  Questions answered.  Patient verbalizes understanding.  Elisabeth CaraKatherine Mansur Patti M.Odis LusterEd. R.D. LDN Neonatal Nutrition Support Specialist/RD III Pager [phone]

## 2015-02-06 LAB — TYPE AND SCREEN
ABO/RH(D): O POS
ANTIBODY SCREEN: NEGATIVE

## 2015-02-06 LAB — GLUCOSE, CAPILLARY
GLUCOSE-CAPILLARY: 112 mg/dL — AB (ref 70–99)
GLUCOSE-CAPILLARY: 89 mg/dL (ref 70–99)
Glucose-Capillary: 102 mg/dL — ABNORMAL HIGH (ref 70–99)
Glucose-Capillary: 120 mg/dL — ABNORMAL HIGH (ref 70–99)

## 2015-02-06 NOTE — Progress Notes (Signed)
Patient ID: Madelyn FlavorsJennifer [place]ington, female   DOB: 1981-03-21, 34 y.o.   MRN: [id number] Pt without complaints VSSAF FHR 140s Glc 2 h good FBS 102  Abd Gravid nt  IMPRESSION: IUP at 5826 w 3 days PPROM Abnormal 3h GTT= Gestational DM  Plan: Diabetic teaching/ diet / monitoring began yesterday.  Will give diet a couple of days, and if FBS remains elevated, consider evening NPH  insulin Status post antibiotics and steroids Continue in hospital management

## 2015-02-06 NOTE — Progress Notes (Signed)
Inpatient Diabetes Program Recommendations  AACE/ADA: Inpatient Glycemic Control Goals Target Ranges:  Prepandial:  60-90 mg/dL      Peak postprandial:   less than 120 mg/dL (2 hours)        Results for [name], Preslee (MRN [id number]) as of 02/06/2015 07:22  Ref. Range 02/05/2015 08:03 02/05/2015 10:34 02/05/2015 16:26 02/05/2015 21:22 02/06/2015 06:04  Glucose-Capillary Latest Ref Range: 70-99 mg/dL 782104 (H) 956143 (H) 213111 (H) 93 102 (H)   Current orders for Inpatient glycemic control:CBGs fasting and 2H post prandial  Inpatient Diabetes Program Recommendations Insulin - Basal: Please consider ordering NPH 5 units QHS.  Note: Note fasting glucose is higher than target goals for yesterday and today. CBG of 143 mg/dl at 08:6510:34 yesterday was obtained after patient at Regular diet for breakfast. Diet was changed to gestational carb modified diet starting with lunch yesterday and 2H PP glucose was within target. Please consider ordering low dose NPH for bedtime to get fasting within target glycemic goals. Will continue to follow and make further recommendations as more data is collected.  Thanks, Orlando PennerMarie Verneal Wiers, RN, MSN, CCRN, CDE Diabetes Coordinator Inpatient Diabetes Program [phone] (Team Pager from 8am to 5pm) [phone] ([place] office) [phone] [facility] office)

## 2015-02-07 LAB — GLUCOSE, CAPILLARY
GLUCOSE-CAPILLARY: 122 mg/dL — AB (ref 70–99)
GLUCOSE-CAPILLARY: 130 mg/dL — AB (ref 70–99)
Glucose-Capillary: 108 mg/dL — ABNORMAL HIGH (ref 70–99)
Glucose-Capillary: 100 mg/dL — ABNORMAL HIGH (ref 70–99)

## 2015-02-07 NOTE — Progress Notes (Signed)
Patient ID: Erika FlavorsJennifer Manning, female   DOB: [birth year], 34 y.o.   MRN: [id number] Pt without complaints GFM Compliant with Gest DM diet  VSSAF Glc levels good except fasting 108 this am FHR 130s  Ctxs Rare  Abd Gravid nt Neg homans   IMPRESSION: IUP at 2826 w 4 days PPROM Gestational DM - elevated FBS on diet  Plan: Diabetic teaching/ diet / monitoring began 2 days ago, if  FBS remains elevated, consider evening 5U NPH insulin per diabetic manager Status post antibiotics and steroids Continue in hospital management

## 2015-02-08 LAB — GLUCOSE, CAPILLARY
GLUCOSE-CAPILLARY: 154 mg/dL — AB (ref 70–99)
GLUCOSE-CAPILLARY: 95 mg/dL (ref 70–99)
Glucose-Capillary: 110 mg/dL — ABNORMAL HIGH (ref 70–99)
Glucose-Capillary: 100 mg/dL — ABNORMAL HIGH (ref 70–99)

## 2015-02-08 MED ORDER — INSULIN NPH (HUMAN) (ISOPHANE) 100 UNIT/ML ~~LOC~~ SUSP
5.0000 [IU] | Freq: Every day | SUBCUTANEOUS | Status: DC
  Administered 2015-02-08 – 2015-02-10 (×3): 5 [IU] via SUBCUTANEOUS
  Filled 2015-02-08: qty 10

## 2015-02-08 NOTE — Progress Notes (Signed)
Ur chart review completed.  

## 2015-02-08 NOTE — Progress Notes (Signed)
Patient ID: Erika FlavorsJennifer Manning, female   DOB: 10-03-1981, 34 y.o.   MRN: [id number] S: STILL WITH LEAKAGE NO CTX O: AF VSS      GRAVID UTERUS NONTENDER      FHR CAT ONE A: IUP AT 26.6 WITH PPROM     GEST DIABETES WITH ELEVATED FBS P: EXP MANAGEMENT MAY NEED MEDS FOR ELEVATED FBS

## 2015-02-09 LAB — GLUCOSE, CAPILLARY
GLUCOSE-CAPILLARY: 106 mg/dL — AB (ref 70–99)
GLUCOSE-CAPILLARY: 143 mg/dL — AB (ref 70–99)
GLUCOSE-CAPILLARY: 125 mg/dL — AB (ref 70–99)
Glucose-Capillary: 107 mg/dL — ABNORMAL HIGH (ref 70–99)

## 2015-02-09 LAB — TYPE AND SCREEN
ABO/RH(D): O POS
Antibody Screen: NEGATIVE

## 2015-02-09 NOTE — Progress Notes (Signed)
Inpatient Diabetes Program Recommendations  Diabetes Treatment Program Recommendations  ADA Standards of Care 2016 Diabetes in Pregnancy Target Glucose Ranges:  Fasting: 60 - 90 mg/dL Preprandial: 60 - 161105 mg/dL 1 hr postprandial: Less than 140mg /dL (from first bite of meal) 2 hr postprandial: Less than 120 mg/dL (from first bit of meal)   Inpatient Diabetes Program Recommendations Insulin - Basal: Fasting still slightly high. May want to to increase slightly by 5 units to 10 units NPH at HS.  Thank you Lenor CoffinAnn Brenna Friesenhahn, RN, MSN, CDE  Diabetes Inpatient Program Office: [phone] Pager: [phone] 8:00 am to 5:00 pm

## 2015-02-09 NOTE — Progress Notes (Signed)
No current c/o.  Active FM.  Occ leakage of clear fluid.  No abdominal pain/ctx.  VSS. AF. FHT Cat I  Gen: A&O x 3 Abd: soft, NT Ext: no c/c/e  34 yo G3P0 at 6297w0d with PPROM, A2DM -PPROM  S/p BMZ and Abx  Monitor for s/sx infection  Mag for NP for imminent delivery  VTX by last u/s; next u/s ordered for tomorrow -A2DM  Started NPH last night with continued elevated BG  Diabetic coordinator following; appreciate assistance  Mitchel HonourMegan Ayshia Gramlich, DO

## 2015-02-10 ENCOUNTER — Inpatient Hospital Stay (HOSPITAL_COMMUNITY): Payer: Managed Care, Other (non HMO)

## 2015-02-10 DIAGNOSIS — Z3A27 27 weeks gestation of pregnancy: Secondary | ICD-10-CM | POA: Insufficient documentation

## 2015-02-10 DIAGNOSIS — O99210 Obesity complicating pregnancy, unspecified trimester: Secondary | ICD-10-CM

## 2015-02-10 DIAGNOSIS — O42919 Preterm premature rupture of membranes, unspecified as to length of time between rupture and onset of labor, unspecified trimester: Secondary | ICD-10-CM | POA: Insufficient documentation

## 2015-02-10 DIAGNOSIS — O4100X0 Oligohydramnios, unspecified trimester, not applicable or unspecified: Secondary | ICD-10-CM | POA: Insufficient documentation

## 2015-02-10 LAB — GLUCOSE, CAPILLARY
GLUCOSE-CAPILLARY: 124 mg/dL — AB (ref 70–99)
Glucose-Capillary: 110 mg/dL — ABNORMAL HIGH (ref 70–99)
Glucose-Capillary: 115 mg/dL — ABNORMAL HIGH (ref 70–99)
Glucose-Capillary: 133 mg/dL — ABNORMAL HIGH (ref 70–99)

## 2015-02-10 NOTE — Progress Notes (Signed)
Pt reports having small amount of blood tinged fluid on peri pad. States felt painless Erika PeltonBraxton Hicks earlier, "nothing serious".  Denies CSX CorporationBraxton Hicks or pain @ present.

## 2015-02-10 NOTE — Progress Notes (Signed)
5959w1d   S/  No new complaoints, good FM  O// BP 102/44 mmHg  Pulse 91  Temp(Src) 98.4 F (36.9 C) (Oral)  Resp 20  Ht 5\' 6"  (1.676 m)  Wt 238 lb 14.4 oz (108.364 kg)  BMI 38.58 kg/m2  for US this am  A+P// 5359w1d, PPROM, for US today

## 2015-02-10 NOTE — Progress Notes (Signed)
Inpatient Diabetes Program Recommendations  Diabetes Treatment Program Recommendations  ADA Standards of Care 2016 Diabetes in Pregnancy Target Glucose Ranges:  Fasting: 60 - 90 mg/dL Preprandial: 60 - 161105 mg/dL 1 hr postprandial: Less than 140mg /dL (from first bite of meal) 2 hr postprandial: Less than 120 mg/dL (from first bit of meal)   Results for [name], Erika Manning (MRN [id number]) as of 02/10/2015 08:14  Ref. Range 02/09/2015 08:02 02/09/2015 10:59 02/09/2015 15:28 02/09/2015 21:46 02/10/2015 08:02  Glucose-Capillary Latest Ref Range: 70-99 mg/dL 811106 (H) 914143 (H) 782107 (H) 125 (H) 110 (H)   Current orders for Inpatient glycemic control: NPH 5 units QHS  Inpatient Diabetes Program Recommendations Insulin - Basal: Please increase NPH to 10 units QHS.  Thanks, Orlando PennerMarie Maleia Weems, RN, MSN, CCRN, CDE Diabetes Coordinator Inpatient Diabetes Program [phone] (Team Pager from 8am to 5pm) [phone] ([place] office) [phone] [facility] office)

## 2015-02-11 LAB — GLUCOSE, CAPILLARY
GLUCOSE-CAPILLARY: 101 mg/dL — AB (ref 70–99)
Glucose-Capillary: 90 mg/dL (ref 70–99)
Glucose-Capillary: 107 mg/dL — ABNORMAL HIGH (ref 70–99)
Glucose-Capillary: 112 mg/dL — ABNORMAL HIGH (ref 70–99)

## 2015-02-11 MED ORDER — INSULIN NPH (HUMAN) (ISOPHANE) 100 UNIT/ML ~~LOC~~ SUSP
10.0000 [IU] | Freq: Every day | SUBCUTANEOUS | Status: DC
  Administered 2015-02-11 – 2015-02-20 (×10): 10 [IU] via SUBCUTANEOUS

## 2015-02-11 NOTE — Progress Notes (Signed)
No current c/o. Active FM. Occ leakage of clear fluid. No abdominal pain/ctx.  VSS. AF. FHT Cat I  Gen: A&O x 3 Abd: soft, NT Ext: no c/c/e  34 yo G3P0 at 1542w2d with PPROM, A2DM -PPROM S/p BMZ and Abx Monitor for s/sx infection Mag for NP for imminent delivery VTX by last u/s -A2DM NPH increased to 10u QHS Diabetic coordinator following; appreciate assistance

## 2015-02-11 NOTE — Progress Notes (Signed)
Ur chart review completed.  

## 2015-02-12 LAB — GLUCOSE, CAPILLARY
GLUCOSE-CAPILLARY: 106 mg/dL — AB (ref 70–99)
GLUCOSE-CAPILLARY: 107 mg/dL — AB (ref 70–99)
Glucose-Capillary: 131 mg/dL — ABNORMAL HIGH (ref 70–99)
Glucose-Capillary: 106 mg/dL — ABNORMAL HIGH (ref 70–99)

## 2015-02-12 LAB — TYPE AND SCREEN
ABO/RH(D): O POS
ANTIBODY SCREEN: NEGATIVE

## 2015-02-12 NOTE — Progress Notes (Signed)
S:  Patient is resting well.  No complaints.  O:  BP 97/46 mmHg  Pulse 91  Temp(Src) 97.8 F (36.6 C) (Oral)  Resp 20  Ht 5\' 6"  (1.676 m)  Wt 108.319 kg (238 lb 12.8 oz)  BMI 38.56 kg/m2 Results for orders placed or performed during the hospital encounter of 01/16/15 (from the past 24 hour(s))  Glucose, capillary     Status: Abnormal   Collection Time: 02/11/15  8:54 AM  Result Value Ref Range   Glucose-Capillary 101 (H) 70 - 99 mg/dL   Comment 1 Document in Chart    Comment 2 Repeat Test   Glucose, capillary     Status: None   Collection Time: 02/11/15 11:37 AM  Result Value Ref Range   Glucose-Capillary 90 70 - 99 mg/dL   Comment 1 Document in Chart    Comment 2 Repeat Test   Glucose, capillary     Status: Abnormal   Collection Time: 02/11/15  3:57 PM  Result Value Ref Range   Glucose-Capillary 107 (H) 70 - 99 mg/dL   Comment 1 Document in Chart    Comment 2 Repeat Test   Glucose, capillary     Status: Abnormal   Collection Time: 02/11/15 10:24 PM  Result Value Ref Range   Glucose-Capillary 112 (H) 70 - 99 mg/dL   Impression: IUP at 27 w 3 days PPROM A2DM  PLAN: Continue expectant management  Received steroids and latency antibiotics Magnesium for delivery  Continue Insulin CBGs

## 2015-02-12 NOTE — Progress Notes (Addendum)
Inpatient Diabetes Program Recommendations  Diabetes Treatment Program Recommendations  ADA Standards of Care 2016 Diabetes in Pregnancy Target Glucose Ranges:  Fasting: 60 - 90 mg/dL Preprandial: 60 - 528105 mg/dL 1 hr postprandial: Less than 140mg /dL (from first bite of meal) 2 hr postprandial: Less than 120 mg/dL (from first bit of meal)   Have requested fasting glucose be checked earlier in am due to duration of action of HS NPH insulin which will work approximately 8-10 hrs (actively) May need NPH at HS at 13-15 units (HS only at this point). If needs correction insulin, glad to assist with dosing correction and/or meal coverage.  Inpatient Diabetes Program Recommendations Insulin - Basal: Pt may need a slight increase in HS NPH dose-glucose this am slightly elevated at 106 mg/dL. However, fasting not checked until after 0900 this am-. Requested nuring to get fasting glucose no later than 0800 but preferably by 0700 due to the duration of action of the NPH given at HS    Thank you Lenor CoffinAnn Daylan Juhnke, RN, MSN, CDE  Diabetes Inpatient Program Office: [phone] Pager: [phone] 8:00 am to 5:00 pm  Ad: Spoke with pt regarding any concerns or questions. Pt states she is doing well and understand the nature of her GDM. AC

## 2015-02-12 NOTE — Progress Notes (Signed)
Nutrition Follow-up Re: diet education consult placed by Diabetic Educator Pt was seen by RD on 4/29, presented with GDM diet education materials, which were explained to her. Pt indicated good understanding of diet. Questions were answered.  Erika Manning M.Odis LusterEd. R.D. LDN Neonatal Nutrition Support Specialist/RD III Pager [phone]

## 2015-02-13 LAB — GLUCOSE, CAPILLARY
Glucose-Capillary: 95 mg/dL (ref 70–99)
Glucose-Capillary: 134 mg/dL — ABNORMAL HIGH (ref 70–99)
Glucose-Capillary: 132 mg/dL — ABNORMAL HIGH (ref 70–99)
Glucose-Capillary: 138 mg/dL — ABNORMAL HIGH (ref 70–99)

## 2015-02-13 MED ORDER — NIFEDIPINE 10 MG PO CAPS
10.0000 mg | ORAL_CAPSULE | Freq: Four times a day (QID) | ORAL | Status: DC
  Administered 2015-02-13 – 2015-02-22 (×38): 10 mg via ORAL
  Filled 2015-02-13 (×40): qty 1

## 2015-02-13 NOTE — Progress Notes (Signed)
S:  Patient reports contractions every 30 minutes Reports mucus discharge Reports good fetal movement  O: BP 109/57 mmHg  Pulse 86  Temp(Src) 98.5 F (36.9 C) (Oral)  Resp 18  Ht 5\' 6"  (1.676 m)  Wt 108.319 kg (238 lb 12.8 oz)  BMI 38.56 kg/m2 Results for orders placed or performed during the hospital encounter of 01/16/15 (from the past 24 hour(s))  Glucose, capillary     Status: Abnormal   Collection Time: 02/12/15  9:06 AM  Result Value Ref Range   Glucose-Capillary 106 (H) 70 - 99 mg/dL  Glucose, capillary     Status: Abnormal   Collection Time: 02/12/15 11:13 AM  Result Value Ref Range   Glucose-Capillary 107 (H) 70 - 99 mg/dL   Comment 1 Document in Chart    Comment 2 Repeat Test   Glucose, capillary     Status: Abnormal   Collection Time: 02/12/15  4:34 PM  Result Value Ref Range   Glucose-Capillary 131 (H) 70 - 99 mg/dL   Comment 1 Notify RN    Comment 2 Document in Chart   Type and screen     Status: None   Collection Time: 02/12/15  7:38 PM  Result Value Ref Range   ABO/RH(D) O POS    Antibody Screen NEG    Sample Expiration 02/15/2015   Glucose, capillary     Status: Abnormal   Collection Time: 02/12/15  9:34 PM  Result Value Ref Range   Glucose-Capillary 106 (H) 70 - 99 mg/dL   Abdomen is soft and non tender  IMPRESSION: IUP at 27 w 4 days PPROM A2 DM  PLAN: Status post antibiotics and steroids Start procardia 10 every 6 hours for contractions Monitor for chorioamnionitis Continue insulin and CBGs

## 2015-02-14 LAB — GLUCOSE, CAPILLARY
GLUCOSE-CAPILLARY: 86 mg/dL (ref 70–99)
Glucose-Capillary: 99 mg/dL (ref 70–99)
Glucose-Capillary: 120 mg/dL — ABNORMAL HIGH (ref 70–99)
Glucose-Capillary: 131 mg/dL — ABNORMAL HIGH (ref 70–99)

## 2015-02-14 NOTE — Progress Notes (Signed)
S:  Patient reports cramping is improved. Good fetal movement. Minimal discharge.  O:  BP 105/46 mmHg  Pulse 91  Temp(Src) 98.4 F (36.9 C) (Oral)  Resp 18  Ht 5\' 6"  (1.676 m)  Wt 108.319 kg (238 lb 12.8 oz)  BMI 38.56 kg/m2 Results for orders placed or performed during the hospital encounter of 01/16/15 (from the past 24 hour(s))  Glucose, capillary     Status: Abnormal   Collection Time: 02/13/15 11:00 AM  Result Value Ref Range   Glucose-Capillary 134 (H) 70 - 99 mg/dL   Comment 1 Notify RN   Glucose, capillary     Status: Abnormal   Collection Time: 02/13/15  4:51 PM  Result Value Ref Range   Glucose-Capillary 132 (H) 70 - 99 mg/dL   Comment 1 Notify RN   Glucose, capillary     Status: Abnormal   Collection Time: 02/13/15  9:40 PM  Result Value Ref Range   Glucose-Capillary 138 (H) 70 - 99 mg/dL  Glucose, capillary     Status: None   Collection Time: 02/14/15  8:22 AM  Result Value Ref Range   Glucose-Capillary 99 70 - 99 mg/dL   Comment 1 Notify RN    Abdomen is soft and non tender  IMPRESSION: IUP at 27 w 5 days PPROM A2DM  PLAN: Status post antibiotics and steroids On procardia since yesterday and has responded well Monitor for chorioamnionitis Continue insulin and CBG and diet

## 2015-02-15 LAB — GLUCOSE, CAPILLARY
GLUCOSE-CAPILLARY: 97 mg/dL (ref 70–99)
GLUCOSE-CAPILLARY: 142 mg/dL — AB (ref 70–99)
Glucose-Capillary: 119 mg/dL — ABNORMAL HIGH (ref 70–99)
Glucose-Capillary: 144 mg/dL — ABNORMAL HIGH (ref 70–99)

## 2015-02-15 LAB — TYPE AND SCREEN
ABO/RH(D): O POS
Antibody Screen: NEGATIVE

## 2015-02-15 NOTE — Progress Notes (Signed)
Ur chart review completed.  

## 2015-02-15 NOTE — Progress Notes (Signed)
Diabetes Treatment Program Recommendations  ADA Standards of Care 2016 Diabetes in Pregnancy Target Glucose Ranges:  Fasting: 60 - 90 mg/dL Preprandial: 60 - 161105 mg/dL 1 hr postprandial: Less than 140mg /dL (from first bite of meal) 2 hr postprandial: Less than 120 mg/dL (from first bit of meal)  Results for [name], Irean (MRN [id number]) as of 02/15/2015 07:27  Ref. Range 02/14/2015 08:22 02/14/2015 11:06 02/14/2015 16:30 02/14/2015 21:36 02/15/2015 06:13  Glucose-Capillary Latest Ref Range: 70-99 mg/dL 99 811120 (H) 86 914131 (H) 97   Results for [name], Lianny (MRN [id number]) as of 02/15/2015 07:27  Ref. Range 02/13/2015 07:49 02/13/2015 11:00 02/13/2015 16:51 02/13/2015 21:40  Glucose-Capillary Latest Ref Range: 70-99 mg/dL 95 086134 (H) 578132 (H) 469138 (H)   Current orders for Inpatient glycemic control: NPH 10 units QHS  Inpatient Diabetes Program Recommendations Insulin - Basal: Fasting glucose continues to be slightly higher than goal. Please consider increasing NPH to 12 units QHS.  Thanks, Orlando PennerMarie Chikita Dogan, RN, MSN, CCRN, CDE Diabetes Coordinator Inpatient Diabetes Program [phone] (Team Pager from 8am to 5pm) [phone] ([facility] office) [phone] [facility] office)

## 2015-02-15 NOTE — Progress Notes (Signed)
Patient ID: Erika FlavorsJennifer Manning, female   DOB: 02/03/1981, 34 y.o.   MRN: [id number] Pt without complaints GFM Leaking clear fluid Rare ctxs, better on procardia  VSSAF FHR 135 + accels Ctxs occasional  Abd Gravid, nt Neg homans   IMPRESSION: IUP at 6727 w 6days PPROM A2DM  PLAN: Status post antibiotics and steroids On procardia has responded well No sxs of chorioamnionitis Continue insulin  (10 U NPH at HS) and CBG and diet

## 2015-02-16 LAB — GLUCOSE, CAPILLARY
GLUCOSE-CAPILLARY: 99 mg/dL (ref 70–99)
GLUCOSE-CAPILLARY: 118 mg/dL — AB (ref 70–99)
Glucose-Capillary: 103 mg/dL — ABNORMAL HIGH (ref 70–99)
Glucose-Capillary: 146 mg/dL — ABNORMAL HIGH (ref 70–99)

## 2015-02-16 NOTE — Progress Notes (Signed)
Inpatient Diabetes Program Recommendations  ADA Standards of Care 2016 Diabetes in Pregnancy Target Glucose Ranges:  Fasting: 60 - 90 mg/dL Preprandial: 60 - 478105 mg/dL 1 hr postprandial: Less than 140mg /dL (from first bite of meal) 2 hr postprandial: Less than 120 mg/dL (from first bit of meal)  Results for [name], Daun (MRN [id number]) as of 02/16/2015 11:30  Ref. Range 02/15/2015 06:13 02/15/2015 10:45 02/15/2015 15:40 02/15/2015 21:05 02/16/2015 06:15 02/16/2015 10:42  Glucose-Capillary Latest Ref Range: 70-99 mg/dL 97 657142 (H) 846119 (H) 962144 (H) 99 118 (H)    Current orders for Inpatient glycemic control: NPH 10 units QHS  Inpatient Diabetes Program Recommendations Insulin - Basal: Fasting glucose continues to be slightly higher than goal. Please consider increasing NPH to 12 units QHS.  Thanks, Orlando PennerMarie Maylin [place]burg, RN, MSN, CCRN, CDE Diabetes Coordinator Inpatient Diabetes Program [phone] (Team Pager from 8am to 5pm) [phone] ([facility] office) [phone] [facility] office)

## 2015-02-16 NOTE — Progress Notes (Signed)
4672w0d  S// leaking small amt, good FM  O//BP 106/41 mmHg  Pulse 99  Temp(Src) 98.1 F (36.7 C) (Oral)  Resp 18  Ht 5\' 6"  (1.676 m)  Wt 238 lb 12.8 oz (108.319 kg)  BMI 38.56 kg/m2   Stable FHR  A+P// 5572w0d, PPROM, vtx, stable, cont Procardia and follow CBG

## 2015-02-17 LAB — GLUCOSE, CAPILLARY
GLUCOSE-CAPILLARY: 114 mg/dL — AB (ref 70–99)
Glucose-Capillary: 91 mg/dL (ref 70–99)
Glucose-Capillary: 115 mg/dL — ABNORMAL HIGH (ref 70–99)
Glucose-Capillary: 114 mg/dL — ABNORMAL HIGH (ref 70–99)

## 2015-02-17 NOTE — Progress Notes (Signed)
Inpatient Diabetes Program Recommendations  AACE/ADA: New Consensus Statement on Inpatient Glycemic Control (2013)  Target Ranges:  Prepandial:   less than 140 mg/dL      Peak postprandial:   less than 180 mg/dL (1-2 hours)      Critically ill patients:  140 - 180 mg/dL   Inpatient Diabetes Program Recommendations Insulin - Basal: Fasting glucose overall is fairly well controlled. However would recommend slight increase to 12 units at HS.However post-prandial glucose levels beginning to exceed target of 120 mg/dL 2 hrs following first bite of each meal. Thus, would recommend starting NPH in the am at 10 units.  This in turn may help control the elevated post-prandials prior to HS.  Thank you Lenor CoffinAnn Sofhia Ulibarri, RN, MSN, CDE  Diabetes Inpatient Program Office: [phone] Pager: [phone] 8:00 am to 5:00 pm

## 2015-02-17 NOTE — Progress Notes (Signed)
No current c/o. Active FM. Occ leakage of clear fluid. No abdominal pain/ctx.  VSS. AF. FHT Cat I  Gen: A&O x 3 Abd: soft, NT Ext: no c/c/e  34 yo G3P0 at 11068w1d with PPROM, A2DM -PPROM S/p BMZ and Abx Monitor for s/sx infection Mag for NP for imminent delivery VTX by last u/s -A2DM NPH qhs  Good control  Erika HonourMegan Linley Moxley, DO

## 2015-02-18 LAB — GLUCOSE, CAPILLARY
GLUCOSE-CAPILLARY: 96 mg/dL (ref 65–99)
GLUCOSE-CAPILLARY: 128 mg/dL — AB (ref 65–99)
Glucose-Capillary: 87 mg/dL (ref 65–99)
Glucose-Capillary: 119 mg/dL — ABNORMAL HIGH (ref 65–99)

## 2015-02-18 LAB — TYPE AND SCREEN
ABO/RH(D): O POS
ANTIBODY SCREEN: NEGATIVE

## 2015-02-18 NOTE — Plan of Care (Signed)
Problem: Phase II Progression Outcomes Goal: Labs/tests as ordered Labs/tests as ordered (Magnesium level, CBG's, CBC, CMET, 24 hr Urine, Amniocentesis, Ultrasound, Other)  Outcome: Progressing CBGs are fasting and 2 hours post prandial

## 2015-02-18 NOTE — Progress Notes (Signed)
Some leaks, no blood, no pain  VSS Afeb  FHT Cat One  Uterus soft, NT  A/P - PPROM-Vtx          S/P BMTZ and Atb          MgSO4 for NP if labors        - A2DM         Good glu control         NPH @ HS

## 2015-02-18 NOTE — Progress Notes (Signed)
Ur chart review completed.  

## 2015-02-19 LAB — GLUCOSE, CAPILLARY
GLUCOSE-CAPILLARY: 99 mg/dL (ref 65–99)
Glucose-Capillary: 118 mg/dL — ABNORMAL HIGH (ref 65–99)
Glucose-Capillary: 97 mg/dL (ref 65–99)
Glucose-Capillary: 136 mg/dL — ABNORMAL HIGH (ref 65–99)

## 2015-02-19 NOTE — Progress Notes (Signed)
Pt going on wheelchair ride escorted by family & friends.

## 2015-02-19 NOTE — Progress Notes (Signed)
No current c/o. Active FM. Occ leakage of clear fluid. Denies pain, VB.  VSS. AF. FHT Cat I  Gen: A&O x 3 Abd: gravid, NT Ext: no c/c/e  34 yo G3P0 at 7974w3d with PPROM, A2DM -PPROM S/p BMZ and Abx Monitor for s/sx infection Mag for NP for imminent delivery VTX by last u/s -A2DM NPH qhs mildly elevated fasting this am but overall good control

## 2015-02-20 LAB — GLUCOSE, CAPILLARY
GLUCOSE-CAPILLARY: 99 mg/dL (ref 65–99)
Glucose-Capillary: 114 mg/dL — ABNORMAL HIGH (ref 65–99)
Glucose-Capillary: 115 mg/dL — ABNORMAL HIGH (ref 65–99)
Glucose-Capillary: 98 mg/dL (ref 65–99)

## 2015-02-20 NOTE — Progress Notes (Signed)
No current c/o. Active FM. Occ leakage of clear fluid. Denies pain, VB.  VSS. AF. FHT Cat I  Gen: A&O x 3 Abd: gravid, NT Ext: no c/c/e  34 yo G3P0 at 582w4d with PPROM, A2DM -PPROM S/p BMZ and Abx Monitor for s/sx infection Mag for NP for imminent delivery VTX by last u/s -A2DM NPH qhs mildly elevated fasting this am but overall good control - consider increase in QHS NPH if remains elevated

## 2015-02-21 LAB — TYPE AND SCREEN
ABO/RH(D): O POS
Antibody Screen: NEGATIVE

## 2015-02-21 LAB — GLUCOSE, CAPILLARY
GLUCOSE-CAPILLARY: 97 mg/dL (ref 65–99)
GLUCOSE-CAPILLARY: 98 mg/dL (ref 65–99)
GLUCOSE-CAPILLARY: 117 mg/dL — AB (ref 65–99)
Glucose-Capillary: 119 mg/dL — ABNORMAL HIGH (ref 65–99)

## 2015-02-21 MED ORDER — INSULIN NPH (HUMAN) (ISOPHANE) 100 UNIT/ML ~~LOC~~ SUSP
12.0000 [IU] | Freq: Every day | SUBCUTANEOUS | Status: DC
  Administered 2015-02-21 – 2015-02-22 (×2): 12 [IU] via SUBCUTANEOUS

## 2015-02-21 MED ORDER — NIFEDIPINE 10 MG PO CAPS
10.0000 mg | ORAL_CAPSULE | Freq: Once | ORAL | Status: AC
  Administered 2015-02-21: 10 mg via ORAL

## 2015-02-21 NOTE — Progress Notes (Signed)
Active FM. Occ leakage of clear fluid. Denies pain, VB.  VSS. AF. FHT Cat I  Gen: A&O x 3 Abd: gravid, NT Ext: no c/c/e  34 yo G3P0 at 5755w5d with PPROM, A2DM -PPROM S/p BMZ and Abx Monitor for s/sx infection Mag for NP for imminent delivery VTX by last u/s -A2DM NPH qhs mildly elevated fasting x 1 week - NPH increased to 12 u QHS.

## 2015-02-22 LAB — GLUCOSE, CAPILLARY
GLUCOSE-CAPILLARY: 109 mg/dL — AB (ref 65–99)
Glucose-Capillary: 137 mg/dL — ABNORMAL HIGH (ref 65–99)
Glucose-Capillary: 122 mg/dL — ABNORMAL HIGH (ref 65–99)
Glucose-Capillary: 126 mg/dL — ABNORMAL HIGH (ref 65–99)

## 2015-02-22 NOTE — Progress Notes (Signed)
Ur chart review completed.  

## 2015-02-22 NOTE — Progress Notes (Signed)
1683w6d  S// no new complaints, good FM  O// BP 105/51 mmHg  Pulse 98  Temp(Src) 97.7 F (36.5 C) (Oral)  Resp 18  Ht 5\' 6"  (1.676 m)  Wt 238 lb 1.6 oz (108.001 kg)  BMI 38.45 kg/m2  CBC    Component Value Date/Time   WBC 11.0* 01/16/2015 1033   RBC 3.85* 01/16/2015 1033   HGB 10.1* 01/16/2015 1033   HCT 30.1* 01/16/2015 1033   PLT 293 01/16/2015 1033   MCV 78.2 01/16/2015 1033   MCH 26.2 01/16/2015 1033   MCHC 33.6 01/16/2015 1033   RDW 13.9 01/16/2015 1033    FHR 142  A+P// 5283w6d, PPROM, stable, US q 3 weeks for growth

## 2015-02-23 LAB — GLUCOSE, CAPILLARY
GLUCOSE-CAPILLARY: 99 mg/dL (ref 65–99)
GLUCOSE-CAPILLARY: 109 mg/dL — AB (ref 65–99)
GLUCOSE-CAPILLARY: 147 mg/dL — AB (ref 65–99)
Glucose-Capillary: 109 mg/dL — ABNORMAL HIGH (ref 65–99)

## 2015-02-23 MED ORDER — INSULIN NPH (HUMAN) (ISOPHANE) 100 UNIT/ML ~~LOC~~ SUSP
15.0000 [IU] | Freq: Every day | SUBCUTANEOUS | Status: DC
Start: 2015-02-23 — End: 2015-02-24
  Administered 2015-02-23: 15 [IU] via SUBCUTANEOUS

## 2015-02-23 MED ORDER — NIFEDIPINE 10 MG PO CAPS
10.0000 mg | ORAL_CAPSULE | Freq: Three times a day (TID) | ORAL | Status: DC
  Administered 2015-02-23 – 2015-02-24 (×5): 10 mg via ORAL
  Filled 2015-02-23 (×8): qty 1

## 2015-02-23 NOTE — Progress Notes (Addendum)
Patient ID: Erika FlavorsJennifer Manning, female   DOB: [birth year], 34 y.o.   MRN: [id number] Pt without complaints GFM Rare ctxs Leaking clear fluid minimally Requests to stay on Procardia because she feels the cramping is less on it  VSSAF Fasting glc levels still elevated (99-106) FHR 135 + accels Ctxs rare  Abd Gravid, Nt Ext.  Neg homans  IMPRESSION: IUP at 29 w 0days PPROM A2DM - FBS elevated  PLAN: Status post antibiotics and steroids On procardia has responded well, will continue per patient request No sxs of chorioamnionitis Continue insulin,will increase(12 U NPH to 15 U  at HS) and CBG and diet Next US for growth at 30 weeks

## 2015-02-23 NOTE — Progress Notes (Signed)
Diabetes in pregnancy: AACE/ADA Target Ranges:  Fasting < 90 mg/dl 2 hrs post-prandial < 161120 mg/dl Inpatient Diabetes Program Recommendations  AACE/ADA: New Consensus Statement on Inpatient Glycemic Control (2016)  Target Ranges:  Prepandial:   less than 140 mg/dL      Peak postprandial:   less than 180 mg/dL (1-2 hours)      Critically ill patients:  140 - 180 mg/dL   Inpatient Diabetes Program Recommendations Insulin - Basal: Basting glucose levels controlled better with increase in NPH to 12 units at HS. Appears though that NPH may benefit from slight increases as she progresses with gestational age.reach the target goals. target for fasting cbg is 60-90 mg/dL  Thank you Lenor CoffinAnn Karimah Winquist, RN, MSN, CDE  Diabetes Inpatient Program Office: [phone] Pager: [phone] 8:00 am to 5:00 pm

## 2015-02-24 ENCOUNTER — Inpatient Hospital Stay (HOSPITAL_COMMUNITY): Payer: Managed Care, Other (non HMO) | Admitting: Anesthesiology

## 2015-02-24 ENCOUNTER — Encounter (HOSPITAL_COMMUNITY): Payer: Self-pay | Admitting: Anesthesiology

## 2015-02-24 LAB — CBC WITH DIFFERENTIAL/PLATELET
BASOS PCT: 0 % (ref 0–1)
Basophils Absolute: 0 10*3/uL (ref 0.0–0.1)
EOS PCT: 3 % (ref 0–5)
Eosinophils Absolute: 0.4 10*3/uL (ref 0.0–0.7)
HEMATOCRIT: 28 % — AB (ref 36.0–46.0)
Hemoglobin: 9.5 g/dL — ABNORMAL LOW (ref 12.0–15.0)
LYMPHS PCT: 19 % (ref 12–46)
Lymphs Abs: 2.1 10*3/uL (ref 0.7–4.0)
MCH: 26.1 pg (ref 26.0–34.0)
MCHC: 33.9 g/dL (ref 30.0–36.0)
MCV: 76.9 fL — ABNORMAL LOW (ref 78.0–100.0)
MONO ABS: 0.4 10*3/uL (ref 0.1–1.0)
Monocytes Relative: 4 % (ref 3–12)
Neutro Abs: 8.1 10*3/uL — ABNORMAL HIGH (ref 1.7–7.7)
Neutrophils Relative %: 74 % (ref 43–77)
PLATELETS: 305 10*3/uL (ref 150–400)
RBC: 3.64 MIL/uL — ABNORMAL LOW (ref 3.87–5.11)
RDW: 13.8 % (ref 11.5–15.5)
WBC: 11.1 10*3/uL — AB (ref 4.0–10.5)

## 2015-02-24 LAB — GLUCOSE, CAPILLARY
GLUCOSE-CAPILLARY: 94 mg/dL (ref 65–99)
GLUCOSE-CAPILLARY: 125 mg/dL — AB (ref 65–99)
Glucose-Capillary: 303 mg/dL — ABNORMAL HIGH (ref 65–99)

## 2015-02-24 LAB — TYPE AND SCREEN
ABO/RH(D): O POS
Antibody Screen: NEGATIVE

## 2015-02-24 MED ORDER — SIMETHICONE 80 MG PO CHEW: 80.0000 mg | CHEWABLE_TABLET | ORAL | Status: DC | PRN

## 2015-02-24 MED ORDER — FENTANYL 2.5 MCG/ML BUPIVACAINE 1/10 % EPIDURAL INFUSION (WH - ANES)
14.0000 mL/h | INTRAMUSCULAR | Status: DC | PRN
  Administered 2015-02-24: 14 mL/h via EPIDURAL

## 2015-02-24 MED ORDER — DIPHENHYDRAMINE HCL 25 MG PO CAPS: 25.0000 mg | ORAL_CAPSULE | Freq: Four times a day (QID) | ORAL | Status: DC | PRN

## 2015-02-24 MED ORDER — PHENYLEPHRINE 40 MCG/ML (10ML) SYRINGE FOR IV PUSH (FOR BLOOD PRESSURE SUPPORT)
80.0000 ug | PREFILLED_SYRINGE | INTRAVENOUS | Status: DC | PRN
  Filled 2015-02-24: qty 2
  Filled 2015-02-24: qty 20

## 2015-02-24 MED ORDER — INSULIN ASPART 100 UNIT/ML ~~LOC~~ SOLN
4.0000 [IU] | Freq: Three times a day (TID) | SUBCUTANEOUS | Status: DC
  Administered 2015-02-25 – 2015-02-26 (×2): 4 [IU] via SUBCUTANEOUS

## 2015-02-24 MED ORDER — LIDOCAINE HCL (PF) 1 % IJ SOLN
INTRAMUSCULAR | Status: DC | PRN
  Administered 2015-02-24 (×2): 4 mL

## 2015-02-24 MED ORDER — ONDANSETRON HCL 4 MG/2ML IJ SOLN: 4.0000 mg | INTRAMUSCULAR | Status: DC | PRN

## 2015-02-24 MED ORDER — LACTATED RINGERS IV SOLN: INTRAVENOUS | Status: DC

## 2015-02-24 MED ORDER — OXYTOCIN 40 UNITS IN LACTATED RINGERS INFUSION - SIMPLE MED: 62.5000 mL/h | INTRAVENOUS | Status: DC

## 2015-02-24 MED ORDER — WITCH HAZEL-GLYCERIN EX PADS: 1.0000 "application " | MEDICATED_PAD | CUTANEOUS | Status: DC | PRN

## 2015-02-24 MED ORDER — FLEET ENEMA 7-19 GM/118ML RE ENEM: 1.0000 | ENEMA | Freq: Every day | RECTAL | Status: DC | PRN

## 2015-02-24 MED ORDER — MAGNESIUM SULFATE BOLUS VIA INFUSION
4.0000 g | Freq: Once | INTRAVENOUS | Status: AC
  Administered 2015-02-24: 4 g via INTRAVENOUS
  Filled 2015-02-24: qty 500

## 2015-02-24 MED ORDER — DIBUCAINE 1 % RE OINT
1.0000 | TOPICAL_OINTMENT | RECTAL | Status: DC | PRN
Start: 2015-02-24 — End: 2015-02-26

## 2015-02-24 MED ORDER — IBUPROFEN 600 MG PO TABS
600.0000 mg | ORAL_TABLET | Freq: Four times a day (QID) | ORAL | Status: DC
  Administered 2015-02-24 – 2015-02-26 (×8): 600 mg via ORAL
  Filled 2015-02-24 (×8): qty 1

## 2015-02-24 MED ORDER — PHENYLEPHRINE 40 MCG/ML (10ML) SYRINGE FOR IV PUSH (FOR BLOOD PRESSURE SUPPORT)
80.0000 ug | PREFILLED_SYRINGE | INTRAVENOUS | Status: DC | PRN
  Filled 2015-02-24: qty 2

## 2015-02-24 MED ORDER — LACTATED RINGERS IV SOLN
INTRAVENOUS | Status: DC
  Administered 2015-02-24: 17:00:00 via INTRAVENOUS

## 2015-02-24 MED ORDER — LANOLIN HYDROUS EX OINT: TOPICAL_OINTMENT | CUTANEOUS | Status: DC | PRN

## 2015-02-24 MED ORDER — ACETAMINOPHEN 325 MG PO TABS: 650.0000 mg | ORAL_TABLET | ORAL | Status: DC | PRN

## 2015-02-24 MED ORDER — BENZOCAINE-MENTHOL 20-0.5 % EX AERO
1.0000 "application " | INHALATION_SPRAY | CUTANEOUS | Status: DC | PRN
  Administered 2015-02-24: 1 via TOPICAL
  Filled 2015-02-24: qty 56

## 2015-02-24 MED ORDER — DIPHENHYDRAMINE HCL 50 MG/ML IJ SOLN: 12.5000 mg | INTRAMUSCULAR | Status: DC | PRN

## 2015-02-24 MED ORDER — TETANUS-DIPHTH-ACELL PERTUSSIS 5-2.5-18.5 LF-MCG/0.5 IM SUSP
0.5000 mL | Freq: Once | INTRAMUSCULAR | Status: AC
  Administered 2015-02-25: 0.5 mL via INTRAMUSCULAR

## 2015-02-24 MED ORDER — ZOLPIDEM TARTRATE 5 MG PO TABS: 5.0000 mg | ORAL_TABLET | Freq: Every evening | ORAL | Status: DC | PRN

## 2015-02-24 MED ORDER — PRENATAL MULTIVITAMIN CH
1.0000 | ORAL_TABLET | Freq: Every day | ORAL | Status: DC
  Administered 2015-02-25 – 2015-02-26 (×2): 1 via ORAL
  Filled 2015-02-24 (×2): qty 1

## 2015-02-24 MED ORDER — ONDANSETRON HCL 4 MG PO TABS: 4.0000 mg | ORAL_TABLET | ORAL | Status: DC | PRN

## 2015-02-24 MED ORDER — MAGNESIUM SULFATE 50 % IJ SOLN
2.0000 g/h | INTRAVENOUS | Status: DC
  Administered 2015-02-24: 2 g/h via INTRAVENOUS
  Filled 2015-02-24: qty 80

## 2015-02-24 MED ORDER — OXYCODONE-ACETAMINOPHEN 5-325 MG PO TABS: 2.0000 | ORAL_TABLET | ORAL | Status: DC | PRN

## 2015-02-24 MED ORDER — SENNOSIDES-DOCUSATE SODIUM 8.6-50 MG PO TABS
2.0000 | ORAL_TABLET | ORAL | Status: DC
  Administered 2015-02-25 – 2015-02-26 (×2): 2 via ORAL
  Filled 2015-02-24 (×2): qty 2

## 2015-02-24 MED ORDER — FENTANYL 2.5 MCG/ML BUPIVACAINE 1/10 % EPIDURAL INFUSION (WH - ANES)
14.0000 mL/h | INTRAMUSCULAR | Status: DC | PRN
  Administered 2015-02-24: 14 mL/h via EPIDURAL
  Filled 2015-02-24: qty 125

## 2015-02-24 MED ORDER — BISACODYL 10 MG RE SUPP: 10.0000 mg | Freq: Every day | RECTAL | Status: DC | PRN

## 2015-02-24 MED ORDER — OXYTOCIN 40 UNITS IN LACTATED RINGERS INFUSION - SIMPLE MED
INTRAVENOUS | Status: AC
  Administered 2015-02-24: 40 [IU]
  Filled 2015-02-24: qty 1000

## 2015-02-24 MED ORDER — NIFEDIPINE 10 MG PO CAPS
10.0000 mg | ORAL_CAPSULE | Freq: Once | ORAL | Status: AC
  Administered 2015-02-24: 10 mg via ORAL
  Filled 2015-02-24: qty 1

## 2015-02-24 MED ORDER — INSULIN ASPART 100 UNIT/ML ~~LOC~~ SOLN: 0.0000 [IU] | Freq: Three times a day (TID) | SUBCUTANEOUS | Status: DC

## 2015-02-24 MED ORDER — OXYCODONE-ACETAMINOPHEN 5-325 MG PO TABS: 1.0000 | ORAL_TABLET | ORAL | Status: DC | PRN

## 2015-02-24 MED ORDER — MEASLES, MUMPS & RUBELLA VAC ~~LOC~~ INJ
0.5000 mL | INJECTION | Freq: Once | SUBCUTANEOUS | Status: AC
  Administered 2015-02-25: 0.5 mL via SUBCUTANEOUS
  Filled 2015-02-24 (×2): qty 0.5

## 2015-02-24 MED ORDER — MEDROXYPROGESTERONE ACETATE 150 MG/ML IM SUSP: 150.0000 mg | INTRAMUSCULAR | Status: DC | PRN

## 2015-02-24 MED ORDER — EPHEDRINE 5 MG/ML INJ
10.0000 mg | INTRAVENOUS | Status: DC | PRN
  Filled 2015-02-24: qty 2

## 2015-02-24 MED ORDER — INSULIN ASPART 100 UNIT/ML ~~LOC~~ SOLN
0.0000 [IU] | Freq: Every day | SUBCUTANEOUS | Status: DC
  Administered 2015-02-24: 4 [IU] via SUBCUTANEOUS

## 2015-02-24 NOTE — Progress Notes (Signed)
Patient's contractions have intensified. Cervix progressed from 4 to 5-6. Epidural ordered and just placed. Cervix is now 9 cm Anticipate NSVD NICU informed  - on standby

## 2015-02-24 NOTE — Progress Notes (Signed)
Patient reports that since 2 pm her abdominal pain has been off and on and getting stronger  Afebrile Abdomen uterus is firm to palpation Cervix 90% 4 cm -2 Vertex by palpation and by bedside ultrasound  Possible labor Discussed with patient Will transfer to L and D Start Magnesium for neuroprotection Expectantly manage labor for now

## 2015-02-24 NOTE — Progress Notes (Signed)
Pt c/o abdominal cramping & tightening.  EFM applied to eval pt's uterine activity.

## 2015-02-24 NOTE — Progress Notes (Signed)
S:  This am, the patient reports that she is feeling cramping. Not as frequent but when it occurs it is more intense. Reports good fetal movement.  No changes in discharge.  O:  BP 114/48 mmHg  Pulse 92  Temp(Src) 98.4 F (36.9 C) (Oral)  Resp 18  Ht 5\' 6"  (1.676 m)  Wt 107.593 kg (237 lb 3.2 oz)  BMI 38.30 kg/m2 Results for orders placed or performed during the hospital encounter of 01/16/15 (from the past 24 hour(s))  Glucose, capillary     Status: Abnormal   Collection Time: 02/23/15 10:42 AM  Result Value Ref Range   Glucose-Capillary 109 (H) 65 - 99 mg/dL   Comment 1 Notify RN    Comment 2 Document in Chart   Glucose, capillary     Status: Abnormal   Collection Time: 02/23/15  3:11 PM  Result Value Ref Range   Glucose-Capillary 109 (H) 65 - 99 mg/dL   Comment 1 Notify RN    Comment 2 Document in Chart   Glucose, capillary     Status: Abnormal   Collection Time: 02/23/15  9:44 PM  Result Value Ref Range   Glucose-Capillary 147 (H) 65 - 99 mg/dL  Glucose, capillary     Status: None   Collection Time: 02/24/15  7:52 AM  Result Value Ref Range   Glucose-Capillary 94 65 - 99 mg/dL   Comment 1 Notify RN    Comment 2 Document in Chart    Abdomen  Soft and non tender  No discharge on pad IMPRESSION: IUP at 29 w 1 days PPROM A2DM  PLAN: Place on toco this am Monitor for contractions and signs of chorioamnionitis Continue procardia Continue insulin and CBG monitoring

## 2015-02-24 NOTE — Anesthesia Procedure Notes (Signed)
Epidural Patient location during procedure: OB Start time: 02/24/2015 6:08 PM  Staffing Anesthesiologist: Mal AmabileFOSTER, Journiee Feldkamp Performed by: anesthesiologist   Preanesthetic Checklist Completed: patient identified, site marked, surgical consent, pre-op evaluation, timeout performed, IV checked, risks and benefits discussed and monitors and equipment checked  Epidural Patient position: sitting Prep: site prepped and draped and DuraPrep Patient monitoring: continuous pulse ox and blood pressure Approach: midline Location: L3-L4 Injection technique: LOR air  Needle:  Needle type: Tuohy  Needle gauge: 17 G Needle length: 9 cm and 9 Needle insertion depth: 7 cm Catheter type: closed end flexible Catheter size: 19 Gauge Catheter at skin depth: 12 cm Test dose: negative and Other  Assessment Events: blood not aspirated, injection not painful, no injection resistance, negative IV test and no paresthesia  Additional Notes Patient identified. Risks and benefits discussed including failed block, incomplete  Pain control, post dural puncture headache, nerve damage, paralysis, blood pressure Changes, nausea, vomiting, reactions to medications-both toxic and allergic and post Partum back pain. All questions were answered. Patient expressed understanding and wished to proceed. Sterile technique was used throughout procedure. Epidural site was Dressed with sterile barrier dressing. No paresthesias, signs of intravascular injection Or signs of intrathecal spread were encountered.  Patient was more comfortable after the epidural was dosed. Please see RN's note for documentation of vital signs and FHR which are stable.]

## 2015-02-24 NOTE — Progress Notes (Signed)
LR bolus started for epidural.

## 2015-02-24 NOTE — Anesthesia Preprocedure Evaluation (Signed)
Anesthesia Evaluation  Patient identified by MRN, date of birth, ID band Patient awake    Reviewed: Allergy & Precautions, H&P , Patient's Chart, lab work & pertinent test results  Airway Mallampati: II  TM Distance: >3 FB Neck ROM: full    Dental no notable dental hx. (+) Teeth Intact   Pulmonary neg pulmonary ROS,  breath sounds clear to auscultation  Pulmonary exam normal       Cardiovascular negative cardio ROS Normal cardiovascular examRhythm:regular Rate:Normal     Neuro/Psych negative neurological ROS  negative psych ROS   GI/Hepatic Neg liver ROS, GERD-  Medicated and Controlled,  Endo/Other  negative endocrine ROS  Renal/GU negative Renal ROS  negative genitourinary   Musculoskeletal   Abdominal   Peds  Hematology  (+) anemia ,   Anesthesia Other Findings   Reproductive/Obstetrics (+) Pregnancy                             Anesthesia Physical Anesthesia Plan  ASA: II  Anesthesia Plan: Epidural   Post-op Pain Management:    Induction:   Airway Management Planned:   Additional Equipment:   Intra-op Plan:   Post-operative Plan:   Informed Consent: I have reviewed the patients History and Physical, chart, labs and discussed the procedure including the risks, benefits and alternatives for the proposed anesthesia with the patient or authorized representative who has indicated his/her understanding and acceptance.     Plan Discussed with: Anesthesiologist  Anesthesia Plan Comments:         Anesthesia Quick Evaluation

## 2015-02-25 LAB — CBC
HCT: 27.3 % — ABNORMAL LOW (ref 36.0–46.0)
HEMOGLOBIN: 9.2 g/dL — AB (ref 12.0–15.0)
MCH: 25.6 pg — ABNORMAL LOW (ref 26.0–34.0)
MCHC: 33.7 g/dL (ref 30.0–36.0)
MCV: 76 fL — ABNORMAL LOW (ref 78.0–100.0)
PLATELETS: 290 10*3/uL (ref 150–400)
RBC: 3.59 MIL/uL — ABNORMAL LOW (ref 3.87–5.11)
RDW: 13.7 % (ref 11.5–15.5)
WBC: 13 10*3/uL — ABNORMAL HIGH (ref 4.0–10.5)

## 2015-02-25 LAB — GLUCOSE, CAPILLARY
GLUCOSE-CAPILLARY: 87 mg/dL (ref 65–99)
Glucose-Capillary: 94 mg/dL (ref 65–99)
Glucose-Capillary: 63 mg/dL — ABNORMAL LOW (ref 65–99)
Glucose-Capillary: 78 mg/dL (ref 65–99)
Glucose-Capillary: 119 mg/dL — ABNORMAL HIGH (ref 65–99)

## 2015-02-25 LAB — RPR: RPR Ser Ql: NONREACTIVE

## 2015-02-25 NOTE — Progress Notes (Signed)
Post Partum Day one Subjective: no complaints  Objective: Blood pressure 112/67, pulse 76, temperature 98.1 F (36.7 C), temperature source Oral, resp. rate 16, height 5\' 6"  (1.676 m), weight 107.593 kg (237 lb 3.2 oz), SpO2 100 %, unknown if currently breastfeeding.  Physical Exam:  General: alert Lochia: appropriate Uterine Fundus: firm Incision: healing well DVT Evaluation: No evidence of DVT seen on physical exam.   Recent Labs  02/24/15 1120 02/25/15 0520  HGB 9.5* 9.2*  HCT 28.0* 27.3*    Assessment/Plan: Plan for discharge tomorrow Baby in NICU  LOS: 40 days   Erika Manning 02/25/2015, 8:17 AM

## 2015-02-25 NOTE — Anesthesia Postprocedure Evaluation (Signed)
  Anesthesia Post-op Note  Patient: Erika FlavorsJennifer Rallo  Procedure(s) Performed: * No procedures listed *  Patient Location: [facility] Unit  Anesthesia Type:Epidural  Level of Consciousness: awake, alert , oriented and patient cooperative  Airway and Oxygen Therapy: Patient Spontanous Breathing  Post-op Pain: none  Post-op Assessment: Post-op Vital signs reviewed, Patient's Cardiovascular Status Stable, Respiratory Function Stable, Patent Airway, No headache, No backache, No residual numbness and No residual motor weakness  Post-op Vital Signs: Reviewed and stable  Last Vitals:  Filed Vitals:   02/25/15 1141  BP: 111/66  Pulse: 77  Temp: 36.7 C  Resp: 18    Complications: No apparent anesthesia complications

## 2015-02-25 NOTE — Lactation Note (Signed)
This note was copied from the chart of Erika Madelyn FlavorsJennifer Cuellar. Lactation Consultation Note  Patient Name: Erika Madelyn FlavorsJennifer Manning ZOXWR'UToday's Date: 02/25/2015 Reason for consult: Initial assessment;NICU baby;Infant < 6lbs  With this mom of a NICU baby, now 21 hours post partum, and 29 2/7 weeks CGA. 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 Mom has been pumping, I showed her how to set premie setting until her milk transitions in. I increased mom to a 27 flange on the right breast, and is using a 24 on her left.  I reviewed hand expression with her, and she was able to express some drops of clear colostrum. Mom given the paper work for a 2 week rental for her discharge tomrrow. Mom and dad very receptive to teaching, and knows to call for questions/concerns.    Maternal Data Formula Feeding for Exclusion: Yes (baby in NICU) Has patient been taught Hand Expression?: Yes Does the patient have breastfeeding experience prior to this delivery?: No  Feeding    LATCH Score/Interventions                      Lactation Tools Discussed/Used WIC Program: No Pump Review: Setup, frequency, and cleaning;Milk Storage;Other (comment) (premie setting, hand expression, NICU booklet reviewed) Initiated by:: bedside RN Date initiated:: 02/24/15   Consult Status Consult Status: Follow-up Date: 02/26/15 Follow-up type: In-patient    [name], [name] 02/25/2015, 5:29 PM

## 2015-02-25 NOTE — Progress Notes (Signed)
CBG checked when pt's lunch arrived. CBG 64. Pt ate and went to NICU before RN could recheck CBG. Pt's morning CBG 94.

## 2015-02-25 NOTE — Progress Notes (Signed)
Ur chart review completed.  

## 2015-02-26 LAB — GLUCOSE, CAPILLARY
GLUCOSE-CAPILLARY: 69 mg/dL (ref 65–99)
GLUCOSE-CAPILLARY: 85 mg/dL (ref 65–99)
Glucose-Capillary: 98 mg/dL (ref 65–99)

## 2015-02-26 MED ORDER — IBUPROFEN 600 MG PO TABS: 600.0000 mg | ORAL_TABLET | Freq: Four times a day (QID) | ORAL | Status: DC

## 2015-02-26 NOTE — Progress Notes (Signed)
I spent a short time with Erika Manning, Erika Manning.  He reported that it has been a little rough, but he is optimistic and hopeful about how their baby, Erika Manning, is doing.   We will continue to check in on family as we are able, but please also page as needs arise.   [address]Chaplain Katy Andersonlaussen, Bcc Pager, [phone] 10:13 AM    02/26/15 1000  Clinical Encounter Type  Visited With Family  Visit Type Initial

## 2015-02-26 NOTE — Progress Notes (Signed)
Blood sugar  With in normal limits

## 2015-02-26 NOTE — Progress Notes (Signed)
CSW met with parents in [facility]'s third floor room/318 to introduce myself, offer support and complete assessment due to NICU admission at 29 weeks.  CSW spent over an hour with parents providing brief, supportive counseling and education.  Parents very emotional at this time.  Appreciative of visit.  Full documentation to follow. 

## 2015-02-26 NOTE — Discharge Summary (Signed)
Obstetric Discharge Summary Reason for Admission: rupture of membranes Prenatal Procedures: ultrasound Intrapartum Procedures: spontaneous vaginal delivery Postpartum Procedures: none Complications-Operative and Postpartum: none HEMOGLOBIN  Date Value Ref Range Status  02/25/2015 9.2* 12.0 - 15.0 g/dL Final   HCT  Date Value Ref Range Status  02/25/2015 27.3* 36.0 - 46.0 % Final    Physical Exam:  General: alert and cooperative Lochia: appropriate Uterine Fundus: firm Incision: perineum intact DVT Evaluation: No evidence of DVT seen on physical exam. Negative Homan's sign. No cords or calf tenderness. Calf/Ankle edema is present.  Discharge Diagnoses: s/p vag del @29  weeks  Discharge Information: Date: 02/26/2015 Activity: pelvic rest Diet: low carb diet Medications: PNV and Ibuprofen Condition: stable Instructions: refer to practice specific booklet Discharge to: home   Newborn Data: Live born female  Birth Weight: 3 lb (1360 g) APGAR: 7, 8  Home with NICU.  Eon Zunker G 02/26/2015, 9:02 AM

## 2015-02-26 NOTE — Progress Notes (Signed)
Pt ambulated out  Teaching complete  Pt will take blood sugars  As directed by provider

## 2015-02-26 NOTE — Progress Notes (Signed)
Post Partum Day 2 Subjective: no complaints, up ad lib, voiding, tolerating PO and + flatus  Objective: Blood pressure 115/68, pulse 79, temperature 98.2 F (36.8 C), temperature source Oral, resp. rate 18, height 5\' 6"  (1.676 m), weight 237 lb 3.2 oz (107.593 kg), SpO2 100 %, unknown if currently breastfeeding.  Physical Exam:  General: alert and cooperative Lochia: appropriate Uterine Fundus: firm Incision: healing well DVT Evaluation: No evidence of DVT seen on physical exam. Negative Homan's sign. No cords or calf tenderness. No significant calf/ankle edema.   Recent Labs  02/24/15 1120 02/25/15 0520  HGB 9.5* 9.2*  HCT 28.0* 27.3*    Assessment/Plan: Patient is scheduled for discharge today Discussed discharge planning regarding insulin instructions with diabetes coordinator. Patient to be instructed prior to discharge. Discharge is currently on hold until instructions completed   LOS: 41 days   Haddon Fyfe G 02/26/2015, 8:55 AM

## 2015-02-26 NOTE — Lactation Note (Signed)
This note was copied from the chart of Erika Madelyn FlavorsJennifer Furlan. Lactation Consultation Note  Follow up visit made prior to discharge.  Mom is pumping and using hand expression every 3 hours.  She is obtaining a few mls of colostrum.  Praised mom and reviewed colostrum and milk coming to volume.  Mom rented a symphony pump for 2 weeks but will be getting a pump from her insurance company.  Instructed to not go longer then 4-5 hours at night in the first few weeks when milk supply is becoming established.  Reassured mom that we will continue to support her while baby is in NICU.  Encouraged to call with concerns/assist prn.  Patient Name: Erika Manning KGMWN'UToday's Date: 02/26/2015     Maternal Data    Feeding    LATCH Score/Interventions                      Lactation Tools Discussed/Used     Consult Status      Huston FoleyMOULDEN, Anjalina Bergevin S 02/26/2015, 12:46 PM

## 2015-03-01 NOTE — Clinical Social Work Maternal (Signed)
CLINICAL SOCIAL WORK MATERNAL/CHILD NOTE  Patient Details  Name: Erika Manning MRN: [id number] Date of Birth: 1980-11-28  Date:  03/01/2015  Clinical Social Worker Initiating Note:  Lois Slagel E. Brigitte Pulse, [place] Date/ Time Initiated:  02/26/15/1430     Child's Name:  Brightin Journi Bellard   Legal Guardian:   (Parents: Anderson Malta and Marylu Lund)   Need for Interpreter:  None   Date of Referral:        Reason for Referral:   (No referral-NICU admission)   Referral Source:      Address:  [address] [place], [place] [address]  Phone number:  [phone]   Household Members:      Natural Supports (not living in the home):  Extended Family (Parents report that a sister lives here, but that the rest of their family live in [place].)   Professional Supports:     Employment: Full-time   Type of Work:  (MOB works in Chief Strategy Officer.  FOB works at [employer].)   Education:      Pensions consultant:  [employer]   Other Resources:      Cultural/Religious Considerations Which May Impact Care: None stated  Strengths:  Ability to meet basic needs , Compliance with medical plan , Understanding of illness   Risk Factors/Current Problems:  None   Cognitive State:  Alert , Linear Thinking , Insightful , Goal Oriented    Mood/Affect:  Tearful , Interested , Calm    CSW Assessment: CSW met with parents in MOB's room/318 to introduce myself, complete assessment due to baby's admission to NICU at 29.1 weeks and offer support.  Parents were friendly and welcoming, though tearful and seem afraid of baby's medical situation.  MOB was much more talkative than FOB, although he talked when tears didn't prohibit him from doing so.  MOB talked about her pregnancy, time on Antenatal and day of delivery.  CSW validated feelings and encouraged them to allow themselves to be emotional.  CSW talked about common emotions related to the NICU experience as well as signs  and symptoms of PPD to watch for.  CSW encouraged parents to talk with CSW any time they feel they would like to process their feelings and asked them to call CSW if they ever want to have a conference with baby's MDs.  CSW told them not to be alarmed if CSW calls them to schedule a conference as we want to ensure they are being informed and feel comfortable with baby's care.  CSW talked about the importance of monitoring emotions, as well as sleeping and eating habits.  CSW asked that MOB talk with CSW and or her doctor if she has concerns about her emotions at any time.  CSW talked with parents as to what to expect from a NICU admission, in general terms, as they identified this as something they thought would be helpful at this time.  CSW informed parents of support offered by [employer] and encouraged parents to come to [employer] and dinners.  CSW explained ongoing support services offered by NICU CSW and provided contact information.  FOB, especially, thanked CSW for the visit and both parents seemed very grateful for the time to talk with CSW.  CSW will continue to monitor and offer support as needed/desired by family.  CSW spent over an hour with parents today offering support and education.  CSW Plan/Description:  Engineer, mining , Psychosocial Support and Ongoing Assessment of Needs    Alphonzo Cruise, [place]  03/01/2015, 2:58 PM

## 2015-03-02 ENCOUNTER — Other Ambulatory Visit (HOSPITAL_COMMUNITY): Payer: Self-pay | Admitting: Obstetrics & Gynecology

## 2015-03-05 ENCOUNTER — Ambulatory Visit (HOSPITAL_COMMUNITY): Payer: Managed Care, Other (non HMO)

## 2015-03-06 ENCOUNTER — Ambulatory Visit: Payer: Self-pay

## 2015-03-06 NOTE — Lactation Note (Signed)
This note was copied from the chart of Erika Manning. Lactation Consultation Note  Patient Name: Erika Madelyn FlavorsJennifer Francis ZOXWR'UToday's Date: 03/06/2015 Reason for consult: Follow-up assessment;NICU baby   Follow-[facility] with [facility]ICU [facility] 2710 days [facility].  Adjusted GA [redacted] weeks. Mom stated she was reading the NICU booklet and noted that her milk supply (for a 10 day old) should be 60-90 ml/session but she is only producing 30-45 ml /session.  Reports pumping 8 times per day. LC went with mom to pumping room to watch mom pump.  Mom is using #27 flange on right breast and #24 flange on left.  LC increased size on left to #27 but mom may need to go back to #24 as breasts sizes change.   Mom was using Symphony pumps in NICU and is pumping with a rented Symphony at home.  LC turned dial up on pump some but minimal according to mom's comfort level.  Bars on pump 1/2-3/4 way to full suction.   [facility] taught mom how to use hands-on pumping during pump session (as mom has not been doing hands-on pumping).   LC also suggested at the end of pumping session to turn off pump, wait 5-10 minutes, and then either hand express or pump for an additional 5 minutes.  Handout information on herbal galactagogues (Fenugreek and Moringa) given along with recipe for Lactation Cookies.  Encouraged to try Fenugreek first with cookies. Encouraged mom to keep pumping log in NICU booklet to see if hands-on pumping helps increase milk supply. Mom just received her pump from insurance company but has not opened box yet. Encouraged mom to let [facility] know how her pumping and supply is going.   LC saw mom after she finished pumping session and mom stated she pumped 60 ml EBM after using hands-on pumping during this session.     Maternal Data    Feeding    LATCH Score/Interventions                      Lactation Tools Discussed/Used Pump Review: Setup, frequency, and cleaning;Other (comment)  (pumping tips for increasing supply)   Consult Status Consult Status: PRN    Lendon KaVann, Jayda White Walker 03/06/2015, 1:44 PM

## 2015-03-15 ENCOUNTER — Inpatient Hospital Stay (HOSPITAL_COMMUNITY): Payer: Managed Care, Other (non HMO)

## 2015-03-15 ENCOUNTER — Inpatient Hospital Stay (HOSPITAL_COMMUNITY)
Admission: AD | Admit: 2015-03-15 | Discharge: 2015-03-15 | Disposition: A | Discharge status: Home / Self Care | Payer: Managed Care, Other (non HMO) | Source: Ambulatory Visit | Attending: Obstetrics and Gynecology | Admitting: Obstetrics and Gynecology

## 2015-03-15 DIAGNOSIS — R51 Headache: Secondary | ICD-10-CM | POA: Insufficient documentation

## 2015-03-15 DIAGNOSIS — Z886 Allergy status to analgesic agent status: Secondary | ICD-10-CM | POA: Insufficient documentation

## 2015-03-15 DIAGNOSIS — O9089 Other complications of the puerperium, not elsewhere classified: Secondary | ICD-10-CM | POA: Diagnosis not present

## 2015-03-15 DIAGNOSIS — R519 Headache, unspecified: Secondary | ICD-10-CM

## 2015-03-15 DIAGNOSIS — G43909 Migraine, unspecified, not intractable, without status migrainosus: Secondary | ICD-10-CM | POA: Diagnosis present

## 2015-03-15 LAB — COMPREHENSIVE METABOLIC PANEL
ALT: 18 U/L (ref 14–54)
AST: 18 U/L (ref 15–41)
Albumin: 3.6 g/dL (ref 3.5–5.0)
Alkaline Phosphatase: 88 U/L (ref 38–126)
Anion gap: 4 — ABNORMAL LOW (ref 5–15)
BUN: 9 mg/dL (ref 6–20)
CALCIUM: 8.8 mg/dL — AB (ref 8.9–10.3)
CO2: 27 mmol/L (ref 22–32)
Chloride: 109 mmol/L (ref 101–111)
Creatinine, Ser: 0.77 mg/dL (ref 0.44–1.00)
GFR calc non Af Amer: >60 mL/min (ref >60)
GLUCOSE: 84 mg/dL (ref 65–99)
Potassium: 3.3 mmol/L — ABNORMAL LOW (ref 3.5–5.1)
Sodium: 140 mmol/L (ref 135–145)
TOTAL PROTEIN: 6.8 g/dL (ref 6.5–8.1)
Total Bilirubin: 0.2 mg/dL — ABNORMAL LOW (ref 0.3–1.2)

## 2015-03-15 LAB — CBC WITH DIFFERENTIAL/PLATELET
Basophils Absolute: 0 10*3/uL (ref 0.0–0.1)
Basophils Relative: 0 % (ref 0–1)
EOS ABS: 0.5 10*3/uL (ref 0.0–0.7)
Eosinophils Relative: 6 % — ABNORMAL HIGH (ref 0–5)
HCT: 31.9 % — ABNORMAL LOW (ref 36.0–46.0)
Hemoglobin: 10.4 g/dL — ABNORMAL LOW (ref 12.0–15.0)
Lymphocytes Relative: 39 % (ref 12–46)
Lymphs Abs: 3.5 10*3/uL (ref 0.7–4.0)
MCH: 25.4 pg — ABNORMAL LOW (ref 26.0–34.0)
MCHC: 32.6 g/dL (ref 30.0–36.0)
MCV: 77.8 fL — ABNORMAL LOW (ref 78.0–100.0)
MONO ABS: 0.3 10*3/uL (ref 0.1–1.0)
Monocytes Relative: 4 % (ref 3–12)
NEUTROS PCT: 51 % (ref 43–77)
Neutro Abs: 4.6 10*3/uL (ref 1.7–7.7)
PLATELETS: 361 10*3/uL (ref 150–400)
RBC: 4.1 MIL/uL (ref 3.87–5.11)
RDW: 14.1 % (ref 11.5–15.5)
WBC: 9 10*3/uL (ref 4.0–10.5)

## 2015-03-15 LAB — URINE MICROSCOPIC-ADD ON

## 2015-03-15 LAB — URINALYSIS, ROUTINE W REFLEX MICROSCOPIC
Bilirubin Urine: NEGATIVE
Glucose, UA: NEGATIVE mg/dL
KETONES UR: 15 mg/dL — AB
LEUKOCYTES UA: NEGATIVE
NITRITE: NEGATIVE
Protein, ur: NEGATIVE mg/dL
Specific Gravity, Urine: >1.03 — ABNORMAL HIGH (ref 1.005–1.030)
Urobilinogen, UA: 0.2 mg/dL (ref 0.0–1.0)
pH: 6 (ref 5.0–8.0)

## 2015-03-15 MED ORDER — DIPHENHYDRAMINE HCL 50 MG/ML IJ SOLN: 25.0000 mg | Freq: Once | INTRAMUSCULAR | Status: DC

## 2015-03-15 MED ORDER — DEXAMETHASONE SODIUM PHOSPHATE 10 MG/ML IJ SOLN
10.0000 mg | Freq: Once | INTRAMUSCULAR | Status: DC
Start: 2015-03-15 — End: 2015-03-16

## 2015-03-15 MED ORDER — METOCLOPRAMIDE HCL 5 MG/ML IJ SOLN: 10.0000 mg | Freq: Once | INTRAMUSCULAR | Status: DC

## 2015-03-15 MED ORDER — SODIUM CHLORIDE 0.9 % IV BOLUS (SEPSIS)
1000.0000 mL | Freq: Once | INTRAVENOUS | Status: DC
Start: 2015-03-15 — End: 2015-03-16

## 2015-03-15 NOTE — MAU Provider Note (Signed)
History     CSN: [id number]  Arrival date and time: 03/15/15 1700   First Provider Initiated Contact with Patient 03/15/15 1938      Chief Complaint  Patient presents with  . Migraine   HPI Erika Manning is 34 y.o. [phone] presents for evaluation of migraine headache that began 3:30 pm.  She is 3 post partum.  She was in NICU when sxs began.  She had PROM, leaking beginning at 23 week and delivered 5/18, [redacted]w[redacted]d viable female 3 lbs. SVD with epidural. Dx with Gestational diabetes second trimester.   Neg for hx of migraines.  She took OTC Excedrin tabs X 2.     No past medical history on file.  No past surgical history on file.  Family History  Problem Relation Age of Onset  . Hypertension Mother   . Diabetes Maternal Grandmother     History  Substance Use Topics  . Smoking status: Never Smoker   . Smokeless tobacco: Never Used  . Alcohol Use: No    Allergies: No Known Allergies  Prescriptions prior to admission  Medication Sig Dispense Refill Last Dose  . ibuprofen (ADVIL,MOTRIN) 600 MG tablet Take 1 tablet (600 mg total) by mouth every 6 (six) hours. 30 tablet 1   . Prenatal Vit-Fe Fumarate-FA (PRENATAL MULTIVITAMIN) TABS tablet Take 1 tablet by mouth at bedtime.   01/14/2015 at Unknown time    Review of Systems  Constitutional: Negative for fever and chills.  HENT:       Headache began over right frontal area but not spreading across forehead and now involves the right temporal area.   Eyes: Positive for photophobia. Negative for blurred vision and double vision.  Cardiovascular: Negative for chest pain.  Gastrointestinal: Negative for nausea, vomiting and abdominal pain.  Genitourinary:       Neg for abnormal vaginal bleeding  Neurological: Positive for headaches.   Physical Exam   Blood pressure 136/77, pulse 61, temperature 98.3 F (36.8 C), temperature source Oral, resp. rate 20, currently breastfeeding.  Physical Exam  Constitutional: She is oriented to  person, place, and time. She appears well-developed and well-nourished.  Looks like she doesn't feel well  HENT:  Head: Normocephalic.  Neck: Normal range of motion.  Cardiovascular: Normal rate.   Respiratory: Effort normal.  Neurological: She is alert and oriented to person, place, and time. No cranial nerve deficit. Coordination normal.  Skin: Skin is warm and dry.   Results for orders placed or performed during the hospital encounter of 03/15/15 (from the past 24 hour(s))  Urinalysis, Routine w reflex microscopic (not at [facility])     Status: Abnormal   Collection Time: 03/15/15  6:55 PM  Result Value Ref Range   Color, Urine YELLOW YELLOW   APPearance CLEAR CLEAR   Specific Gravity, Urine >1.030 (H) 1.005 - 1.030   pH 6.0 5.0 - 8.0   Glucose, UA NEGATIVE NEGATIVE mg/dL   Hgb urine dipstick LARGE (A) NEGATIVE   Bilirubin Urine NEGATIVE NEGATIVE   Ketones, ur 15 (A) NEGATIVE mg/dL   Protein, ur NEGATIVE NEGATIVE mg/dL   Urobilinogen, UA 0.2 0.0 - 1.0 mg/dL   Nitrite NEGATIVE NEGATIVE   Leukocytes, UA NEGATIVE NEGATIVE  Urine microscopic-add on     Status: Abnormal   Collection Time: 03/15/15  6:55 PM  Result Value Ref Range   Squamous Epithelial / LPF FEW (A) RARE   WBC, UA 3-6 <3 WBC/hpf   RBC / HPF 11-20 <3 RBC/hpf   Bacteria,  UA FEW (A) RARE   Urine-Other MUCOUS PRESENT   CBC with Differential/Platelet     Status: Abnormal   Collection Time: 03/15/15  8:03 PM  Result Value Ref Range   WBC 9.0 4.0 - 10.5 K/uL   RBC 4.10 3.87 - 5.11 MIL/uL   Hemoglobin 10.4 (L) 12.0 - 15.0 g/dL   HCT 11.931.9 (L) 14.736.0 - 82.946.0 %   MCV 77.8 (L) 78.0 - 100.0 fL   MCH 25.4 (L) 26.0 - 34.0 pg   MCHC 32.6 30.0 - 36.0 g/dL   RDW 56.214.1 13.011.5 - 86.515.5 %   Platelets 361 150 - 400 K/uL   Neutrophils Relative % 51 43 - 77 %   Neutro Abs 4.6 1.7 - 7.7 K/uL   Lymphocytes Relative 39 12 - 46 %   Lymphs Abs 3.5 0.7 - 4.0 K/uL   Monocytes Relative 4 3 - 12 %   Monocytes Absolute 0.3 0.1 - 1.0 K/uL    Eosinophils Relative 6 (H) 0 - 5 %   Eosinophils Absolute 0.5 0.0 - 0.7 K/uL   Basophils Relative 0 0 - 1 %   Basophils Absolute 0.0 0.0 - 0.1 K/uL  Comprehensive metabolic panel     Status: Abnormal   Collection Time: 03/15/15  8:03 PM  Result Value Ref Range   Sodium 140 135 - 145 mmol/L   Potassium 3.3 (L) 3.5 - 5.1 mmol/L   Chloride 109 101 - 111 mmol/L   CO2 27 22 - 32 mmol/L   Glucose, Bld 84 65 - 99 mg/dL   BUN 9 6 - 20 mg/dL   Creatinine, Ser 7.840.77 0.44 - 1.00 mg/dL   Calcium 8.8 (L) 8.9 - 10.3 mg/dL   Total Protein 6.8 6.5 - 8.1 g/dL   Albumin 3.6 3.5 - 5.0 g/dL   AST 18 15 - 41 U/L   ALT 18 14 - 54 U/L   Alkaline Phosphatase 88 38 - 126 U/L   Total Bilirubin 0.2 (L) 0.3 - 1.2 mg/dL   GFR calc non Af Amer >60 >60 mL/min   GFR calc Af Amer >60 >60 mL/min   Anion gap 4 (L) 5 - 15   Ct Head Wo Contrast  03/15/2015   CLINICAL DATA:  Diffuse headache which began earlier today. Dizziness. Elevated blood pressure.  EXAM: CT HEAD WITHOUT CONTRAST  TECHNIQUE: Contiguous axial images were obtained from the base of the skull through the vertex without intravenous contrast.  COMPARISON:  None.  FINDINGS: No evidence for acute infarction, hemorrhage, mass lesion, hydrocephalus, or extra-axial fluid. Normal cerebral volume. No white matter disease. Calvarium intact. Clear sinuses and mastoids.  IMPRESSION: Negative exam.   Electronically Signed   By: Davonna BellingJohn  Curnes M.D.   On: 03/15/2015 21:05     MAU Course  Procedures  MDM MSE reported to Dr. Henderson Cloudomblin.  Order for Anesthesia consul, CBC/diff, CT of the head CMP and call him back. 20:00 Care turned over to [place] 2132: D/W Dr. Henderson Cloudomblin CT and lab results. Will try migraine cocktail.  2225: Patient reports that pain is gone now.  2225: D/W Dr. Henderson Cloudomblin, ok for dc home. Will have patient FU in the office later this week.   Assessment and Plan  A:  Headache       3 weeks post partum 1. Acute nonintractable headache, unspecified  headache type   2. Postpartum headache    DC home Comfort measures reviewed   RX: none  Return to MAU as needed FU  with OB as planned  Follow-up Information    Follow up with [facility] Cristie Hem, MD.   Specialty:  Obstetrics and Gynecology   Why:  Make an appointment for follow up this week   Contact information:   [address] [place] [address] [phone]        Peterson Lombard 03/15/2015, 7:40 PM

## 2015-03-15 NOTE — Discharge Instructions (Signed)

## 2015-03-15 NOTE — MAU Note (Signed)
Not in lobby #2- told front desk she wasn't waiting any longer and was going back to NICU

## 2015-03-15 NOTE — MAU Note (Addendum)
Started developing a severe HA this afternoon, came on gradually. Became nauseated and dizzy while up in NICU, told the nurses and they thought she needed to get BP checked.  called office, they were closed for the day.  Gave birth about 3 wks ago, bleeding had pretty much stopped, then on Friday became heavy and passing clots again.  VAG delivery, no problems with BP. Had started going away while in lobby earlier, than when got worked up about pumping, it started going back up. No visual changes or epigastric pain

## 2015-03-15 NOTE — MAU Note (Signed)
Not in lobby

## 2015-03-15 NOTE — MAU Note (Signed)
Urine in lab 

## 2015-03-29 ENCOUNTER — Ambulatory Visit: Payer: Self-pay

## 2015-03-29 NOTE — Lactation Note (Signed)
This note was copied from the chart of Erika Madelyn Flavors. Lactation Consultation Note  Follow up visit with mom in NICU.  She is pumping every 3 hours and obtains 60-90 mls per pumping.  Relaxation and hands on pumping encouraged.  Recommended mom allow herself a 5-6 hour break during the night for increased rest.  Encouraged to call for concerns prn.  Patient Name: Erika Manning KPVVZ'S Date: 03/29/2015     Maternal Data    Feeding Feeding Type: Breast Milk Length of feed: 90 min  LATCH Score/Interventions                      Lactation Tools Discussed/Used     Consult Status      [name] 03/29/2015, 2:06 PM

## 2015-05-24 ENCOUNTER — Ambulatory Visit: Payer: Self-pay

## 2015-05-24 NOTE — Lactation Note (Signed)
This note was copied from the chart of Erika Madelyn Flavors. Lactation Consultation Note  Patient Name: Erika Manning [id number] Date: 05/24/2015 Reason for consult: Follow-up assessment   With this mom and NICU baby, now 2 months old, and 41 6/7 weeks CGA. The baby is still in the NICU due to not feeding well enough yet. I assisted mom with latching baby in football hold. She would not latch until a 24 nipple shield was applied. This fit mom well and baby was able to easily place in her mouth. Milk was seen in the shield, but the baby was  sucking  Intermittently and softly. She was content at the breast, and mom did feel some suckles. Mom to make appointment in NICU lactation book, for further help with breastfeeding.    Maternal Data    Feeding Feeding Type: Breast Fed Nipple Type: Dr. Levert Feinstein Preemie Length of feed: 20 min  LATCH Score/Interventions Latch: Repeated attempts needed to sustain latch, nipple held in mouth throughout feeding, stimulation needed to elicit sucking reflex. (24 nipple shiled used to get baby to latch) Intervention(s): Skin to skin;Teach feeding cues;Waking techniques Intervention(s): Assist with latch;Breast compression;Breast massage;Adjust position  Audible Swallowing: None  Type of Nipple: Everted at rest and after stimulation  Comfort (Breast/Nipple): Soft / non-tender     Hold (Positioning): Assistance needed to correctly position infant at breast and maintain latch. Intervention(s): Breastfeeding basics reviewed;Support Pillows;Position options;Skin to skin  LATCH Score: 6  Lactation Tools Discussed/Used     Consult Status Consult Status: PRN Follow-up type: In-patient (NICU)    Alfred Levins 05/24/2015, 2:21 PM

## 2016-05-18 LAB — OB RESULTS CONSOLE RPR: RPR: NONREACTIVE

## 2016-05-18 LAB — OB RESULTS CONSOLE HIV ANTIBODY (ROUTINE TESTING): HIV: NONREACTIVE

## 2016-05-18 LAB — OB RESULTS CONSOLE GC/CHLAMYDIA
Chlamydia: NEGATIVE
Gonorrhea: NEGATIVE

## 2016-05-18 LAB — OB RESULTS CONSOLE HEPATITIS B SURFACE ANTIGEN: HEP B S AG: NEGATIVE

## 2016-05-18 LAB — OB RESULTS CONSOLE RUBELLA ANTIBODY, IGM: RUBELLA: IMMUNE

## 2016-07-29 ENCOUNTER — Inpatient Hospital Stay (HOSPITAL_COMMUNITY)
Admission: AD | Admit: 2016-07-29 | Discharge: 2016-08-01 | DRG: 782 | Disposition: A | Discharge status: Home / Self Care | Payer: Managed Care, Other (non HMO) | Source: Ambulatory Visit | Attending: Obstetrics and Gynecology | Admitting: Obstetrics and Gynecology

## 2016-07-29 ENCOUNTER — Encounter (HOSPITAL_COMMUNITY): Payer: Self-pay | Admitting: *Deleted

## 2016-07-29 ENCOUNTER — Inpatient Hospital Stay (HOSPITAL_COMMUNITY): Payer: Managed Care, Other (non HMO)

## 2016-07-29 DIAGNOSIS — Z8249 Family history of ischemic heart disease and other diseases of the circulatory system: Secondary | ICD-10-CM

## 2016-07-29 DIAGNOSIS — O42912 Preterm premature rupture of membranes, unspecified as to length of time between rupture and onset of labor, second trimester: Secondary | ICD-10-CM | POA: Diagnosis present

## 2016-07-29 DIAGNOSIS — O4692 Antepartum hemorrhage, unspecified, second trimester: Secondary | ICD-10-CM

## 2016-07-29 DIAGNOSIS — Z833 Family history of diabetes mellitus: Secondary | ICD-10-CM | POA: Diagnosis not present

## 2016-07-29 DIAGNOSIS — O3432 Maternal care for cervical incompetence, second trimester: Principal | ICD-10-CM | POA: Diagnosis present

## 2016-07-29 DIAGNOSIS — Z3A17 17 weeks gestation of pregnancy: Secondary | ICD-10-CM

## 2016-07-29 DIAGNOSIS — Z3A18 18 weeks gestation of pregnancy: Secondary | ICD-10-CM

## 2016-07-29 LAB — CBC
HCT: 28.7 % — ABNORMAL LOW (ref 36.0–46.0)
Hemoglobin: 9.8 g/dL — ABNORMAL LOW (ref 12.0–15.0)
MCH: 26.1 pg (ref 26.0–34.0)
MCHC: 34.1 g/dL (ref 30.0–36.0)
MCV: 76.5 fL — ABNORMAL LOW (ref 78.0–100.0)
PLATELETS: 295 10*3/uL (ref 150–400)
RBC: 3.75 MIL/uL — ABNORMAL LOW (ref 3.87–5.11)
RDW: 13.7 % (ref 11.5–15.5)
WBC: 8.8 10*3/uL (ref 4.0–10.5)

## 2016-07-29 LAB — URINALYSIS, ROUTINE W REFLEX MICROSCOPIC
Bilirubin Urine: NEGATIVE
Glucose, UA: NEGATIVE mg/dL
Ketones, ur: 15 mg/dL — AB
Leukocytes, UA: NEGATIVE
NITRITE: NEGATIVE
Protein, ur: NEGATIVE mg/dL
Specific Gravity, Urine: 1.015 (ref 1.005–1.030)
pH: 6 (ref 5.0–8.0)

## 2016-07-29 LAB — URINE MICROSCOPIC-ADD ON: BACTERIA UA: NONE SEEN

## 2016-07-29 LAB — TYPE AND SCREEN
ABO/RH(D): O POS
ANTIBODY SCREEN: NEGATIVE

## 2016-07-29 MED ORDER — CALCIUM CARBONATE ANTACID 500 MG PO CHEW: 2.0000 | CHEWABLE_TABLET | ORAL | Status: DC | PRN

## 2016-07-29 MED ORDER — SODIUM CHLORIDE 0.9 % IV SOLN
2.0000 g | Freq: Four times a day (QID) | INTRAVENOUS | Status: AC
  Administered 2016-07-29 – 2016-07-31 (×8): 2 g via INTRAVENOUS
  Filled 2016-07-29 (×9): qty 2000

## 2016-07-29 MED ORDER — LACTATED RINGERS IV BOLUS (SEPSIS)
1000.0000 mL | Freq: Once | INTRAVENOUS | Status: AC
  Administered 2016-07-29: 1000 mL via INTRAVENOUS

## 2016-07-29 MED ORDER — ACETAMINOPHEN 325 MG PO TABS
650.0000 mg | ORAL_TABLET | ORAL | Status: DC | PRN
  Administered 2016-07-30 – 2016-07-31 (×3): 650 mg via ORAL
  Filled 2016-07-29 (×3): qty 2

## 2016-07-29 MED ORDER — PRENATAL MULTIVITAMIN CH
1.0000 | ORAL_TABLET | Freq: Every day | ORAL | Status: DC
  Administered 2016-07-29 – 2016-07-31 (×3): 1 via ORAL
  Filled 2016-07-29 (×3): qty 1

## 2016-07-29 MED ORDER — DOCUSATE SODIUM 100 MG PO CAPS
100.0000 mg | ORAL_CAPSULE | Freq: Every day | ORAL | Status: DC
  Administered 2016-07-29 – 2016-08-01 (×4): 100 mg via ORAL
  Filled 2016-07-29 (×4): qty 1

## 2016-07-29 MED ORDER — AZITHROMYCIN 250 MG PO TABS
500.0000 mg | ORAL_TABLET | Freq: Every day | ORAL | Status: DC
  Administered 2016-07-31 – 2016-08-01 (×2): 500 mg via ORAL
  Filled 2016-07-29 (×2): qty 2

## 2016-07-29 MED ORDER — AMOXICILLIN 500 MG PO CAPS
500.0000 mg | ORAL_CAPSULE | Freq: Three times a day (TID) | ORAL | Status: DC
  Administered 2016-07-31 – 2016-08-01 (×4): 500 mg via ORAL
  Filled 2016-07-29 (×4): qty 1

## 2016-07-29 MED ORDER — FAMOTIDINE 20 MG PO TABS
20.0000 mg | ORAL_TABLET | Freq: Two times a day (BID) | ORAL | Status: DC
  Administered 2016-07-29 – 2016-08-01 (×6): 20 mg via ORAL
  Filled 2016-07-29 (×6): qty 1

## 2016-07-29 MED ORDER — DEXTROSE 5 % IV SOLN
500.0000 mg | INTRAVENOUS | Status: AC
  Administered 2016-07-29 – 2016-07-30 (×2): 500 mg via INTRAVENOUS
  Filled 2016-07-29 (×2): qty 500

## 2016-07-29 MED ORDER — ZOLPIDEM TARTRATE 5 MG PO TABS: 5.0000 mg | ORAL_TABLET | Freq: Every evening | ORAL | Status: DC | PRN

## 2016-07-29 MED ORDER — LACTATED RINGERS IV SOLN
INTRAVENOUS | Status: DC
  Administered 2016-07-29 – 2016-07-31 (×4): via INTRAVENOUS

## 2016-07-29 NOTE — H&P (Signed)
Erika FlavorsJennifer Wilmot is a 35 y.o. female presenting for bleeding today.   [id number] at 3638w6d who presents with vaginal bleeding and abdominal pain. Reports vaginal bleeding since 4 am this morning. Initially was spotting but just prior to arrival became heavier and dark red. Abdominal cramping began with bleeding. Feels like contractions. Comes and goes but can't tell how frequent. Rates pain 7/10. Has not treated. Denies n/v/d, constipation, or LOF. No recent intercourse. Last BM was last night and she did strain. No recent ultrasound in the office. History of 29 week delivery   OB History    Gravida Para Term Preterm AB Living   4 1 0 1 2 1    SAB TAB Ectopic Multiple Live Births   0 2 0 0 1     Past Medical History:  Diagnosis Date  . Preterm labor    Past Surgical History:  Procedure Laterality Date  . NO PAST SURGERIES     Family History: family history includes Diabetes in her maternal grandmother; Hypertension in her mother. Social History:  reports that she has never smoked. She has never used smokeless tobacco. She reports that she does not drink alcohol or use drugs.     Maternal Diabetes: No Genetic Screening: Declined Maternal Ultrasounds/Referrals: Normal Fetal Ultrasounds or other Referrals:  None Maternal Substance Abuse:  No Significant Maternal Medications:  None Significant Maternal Lab Results:  None Other Comments:  None  ROS Maternal Medical History:  Reason for admission: Vaginal bleeding.   Contractions: Onset was 1-2 hours ago.    Prenatal complications: Bleeding.   No PIH or pre-eclampsia.   Prenatal Complications - Diabetes: none.      Blood pressure 132/66, pulse 95, temperature 98.1 F (36.7 C), temperature source Oral, resp. rate 18, height 5\' 6"  (1.676 m), weight 247 lb 9.6 oz (112.3 kg), SpO2 99 %, currently breastfeeding. Maternal Exam:  Uterine Assessment: Contraction frequency is rare.   Abdomen: Patient reports no abdominal tenderness.  Fundal height is 17.   Fetal presentation: no presenting part  Introitus: Normal vulva. Vagina is positive for vaginal discharge.  Ferning test: not done.  Nitrazine test: not done. Amniotic fluid character: not assessed.  Cervix: Cervix evaluated by sterile speculum exam.     Fetal Exam Fetal Monitor Review: Mode: hand-held doppler probe.   Baseline rate: 158.      Physical Exam  Constitutional: She is oriented to person, place, and time. She appears well-developed and well-nourished. No distress.  HENT:  Head: Normocephalic.  Cardiovascular: Normal rate and regular rhythm.   Respiratory: Effort normal and breath sounds normal.  GI: Soft. She exhibits no distension. There is no tenderness. There is no rebound and no guarding.  Genitourinary: Uterus normal. Vaginal discharge found.  Genitourinary Comments: See MAU note Bulging membranes  Musculoskeletal: Normal range of motion.  Neurological: She is alert and oriented to person, place, and time.  Skin: Skin is warm and dry.  Psychiatric: She has a normal mood and affect.    Prenatal labs: ABO, Rh:   Antibody:   Rubella:   RPR:    HBsAg:    HIV:    GBS:     Assessment/Plan: SIUP at 3838w6d Preterm dilation with bulging membranes  Admit to antenatal Routine orders Antibiotics Trendelenberg   [facility]ult[facility] HospitalWILLIAMS,Carly Applegate 07/29/2016, 8:32 AM

## 2016-07-29 NOTE — MAU Note (Signed)
Pt states she woke up at 0400 this morning and had some light red bleeding.  Pt states she got up at 0630 and she had a gush of dark red blood.  Pt states that she has been bleeding ever since and has been have contractions that she feels in her abdomen and back ever since.  Pt states the contractions feel close together but she has not timed them.  Pt denies any complications this pregnancy.

## 2016-07-29 NOTE — H&P (Signed)
Erika Manning is an 35 y.o. female, 17 6/[redacted] weeks EGA. Noted blood on tissue about 4 am and again @ 6 am today. Mild cramping at that time, now resolved. No gush, no fever, no N/V. Pregnancy complicated by history of 23 week PPROM admission and 29 week delivery.  Pertinent Gynecological History: Menses: pregnant Bleeding: as above Contraception: none DES exposure: denies Blood transfusions: none Sexually transmitted diseases: no past history Previous GYN Procedures: D&E  Last mammogram: none Date: N/A Last pap: normal Date: 2017 OB History: G4, P1   Menstrual History: Menarche age: unknown Patient's last menstrual period was 03/25/2016 (approximate).    Past Medical History:  Diagnosis Date  . Preterm labor     Past Surgical History:  Procedure Laterality Date  . NO PAST SURGERIES      Family History  Problem Relation Age of Onset  . Hypertension Mother   . Diabetes Maternal Grandmother     Social History:  reports that she has never smoked. She has never used smokeless tobacco. She reports that she does not drink alcohol or use drugs.  Allergies: No Known Allergies  Prescriptions Prior to Admission  Medication Sig Dispense Refill Last Dose  . acidophilus (RISAQUAD) CAPS capsule Take 1 capsule by mouth daily.   07/28/2016 at Unknown time  . cholecalciferol (VITAMIN D) 1000 units tablet Take 1,000 Units by mouth daily.   07/28/2016 at Unknown time  . omeprazole (PRILOSEC) 20 MG capsule Take 20 mg by mouth daily.   07/28/2016 at Unknown time  . Prenatal Vit-Fe Fumarate-FA (PRENATAL MULTIVITAMIN) TABS tablet Take 1 tablet by mouth daily.    07/28/2016 at Unknown time    Review of Systems  Constitutional: Negative for fever.  Eyes: Negative for blurred vision.  Neurological: Negative for headaches.    Blood pressure 109/72, pulse 95, temperature 98.5 F (36.9 C), temperature source Oral, resp. rate 16, height 5\' 6"  (1.676 m), weight 247 lb 9.6 oz (112.3 kg), last  menstrual period 03/25/2016, SpO2 99 %, currently breastfeeding. Physical Exam  Cardiovascular: Normal rate and regular rhythm.   Respiratory: Effort normal and breath sounds normal.  GI: Soft. There is no tenderness.  Neurological: She has normal reflexes.  Uterus NT  Results for orders placed or performed during the hospital encounter of 07/29/16 (from the past 24 hour(s))  Urinalysis, Routine w reflex microscopic (not [facility])     Status: Abnormal   Collection Time: 07/29/16  7:13 AM  Result Value Ref Range   Color, Urine AMBER (A) YELLOW   APPearance HAZY (A) CLEAR   Specific Gravity, Urine 1.015 1.005 - 1.030   pH 6.0 5.0 - 8.0   Glucose, UA NEGATIVE NEGATIVE mg/dL   Hgb urine dipstick LARGE (A) NEGATIVE   Bilirubin Urine NEGATIVE NEGATIVE   Ketones, ur 15 (A) NEGATIVE mg/dL   Protein, ur NEGATIVE NEGATIVE mg/dL   Nitrite NEGATIVE NEGATIVE   Leukocytes, UA NEGATIVE NEGATIVE  Urine microscopic-add on     Status: Abnormal   Collection Time: 07/29/16  7:13 AM  Result Value Ref Range   Squamous Epithelial / LPF 0-5 (A) NONE SEEN   WBC, UA 6-30 0 - 5 WBC/hpf   RBC / HPF TOO NUMEROUS TO COUNT 0 - 5 RBC/hpf   Bacteria, UA NONE SEEN NONE SEEN   Urine-Other MUCOUS PRESENT   CBC     Status: Abnormal   Collection Time: 07/29/16  8:00 AM  Result Value Ref Range   WBC 8.8 4.0 - 10.5  K/uL   RBC 3.75 (L) 3.87 - 5.11 MIL/uL   Hemoglobin 9.8 (L) 12.0 - 15.0 g/dL   HCT 84.6 (L) 96.2 - 95.2 %   MCV 76.5 (L) 78.0 - 100.0 fL   MCH 26.1 26.0 - 34.0 pg   MCHC 34.1 30.0 - 36.0 g/dL   RDW 84.1 32.4 - 40.1 %   Platelets 295 150 - 400 K/uL  Type and screen     Status: None   Collection Time: 07/29/16  8:00 AM  Result Value Ref Range   ABO/RH(D) O POS    Antibody Screen NEG    Sample Expiration 08/01/2016    U/S Transverse, BOW hour glass into vagina, most AF in prolapsed BOW  No results found.  Assessment/Plan: 35 yo G4P1 with cervical insufficiency, no evidence of infection D/W  patient and husband above and options D/W chance of PPROM, infection, uterine abruption Trendelenberg, IV fluids, antbiotics Observe, cbc U/S tomorrow D/W possible cervical cerclage if BOW regresses  Joe Tanney II,Dez Stauffer E 07/29/2016, 10:35 AM

## 2016-07-29 NOTE — MAU Provider Note (Signed)
History     CSN: [id number]  Arrival date and time: 07/29/16 0705   First Provider Initiated Contact with Patient 07/29/16 [phone]      Chief Complaint  Patient presents with  . Vaginal Bleeding  . Contractions   Erika Manning is a 35 y.o. [phone] at [redacted]w[redacted]d who presents with vaginal bleeding and abdominal pain. Reports vaginal bleeding since 4 am this morning. Initially was spotting but just prior to arrival became heavier and dark red. Abdominal cramping began with bleeding. Feels like contractions. Comes and goes but can't tell how frequent. Rates pain 7/10. Has not treated. Denies n/v/d, constipation, or LOF. No recent intercourse. Last BM was last night and she did strain. No recent ultrasound in the office.    Vaginal Bleeding  The patient's primary symptoms include vaginal bleeding. The patient's pertinent negatives include no genital itching or genital odor. This is a new problem. The current episode started today. The problem has been unchanged. She is pregnant. Associated symptoms include abdominal pain. Pertinent negatives include no constipation, diarrhea, dysuria, nausea or vomiting. The vaginal discharge was bloody. The vaginal bleeding is spotting. She has not been passing clots. She has not been passing tissue. Nothing aggravates the symptoms. She has tried nothing for the symptoms.   OB History    Gravida Para Term Preterm AB Living   4 1 0 1 2 1    SAB TAB Ectopic Multiple Live Births   0 2 0 0 1      Past Medical History:  Diagnosis Date  . Preterm labor     Past Surgical History:  Procedure Laterality Date  . NO PAST SURGERIES      Family History  Problem Relation Age of Onset  . Hypertension Mother   . Diabetes Maternal Grandmother     Social History  Substance Use Topics  . Smoking status: Never Smoker  . Smokeless tobacco: Never Used  . Alcohol use No    Allergies: No Known Allergies  Prescriptions Prior to Admission  Medication Sig Dispense  Refill Last Dose  . aspirin-acetaminophen-caffeine (EXCEDRIN MIGRAINE) 250-250-65 MG per tablet Take 2 tablets by mouth every 6 (six) hours as needed for headache or migraine.   03/15/2015 at Unknown time  . ibuprofen (ADVIL,MOTRIN) 600 MG tablet Take 1 tablet (600 mg total) by mouth every 6 (six) hours. (Patient not taking: Reported on 03/15/2015) 30 tablet 1 Not Taking at Unknown time  . omeprazole (PRILOSEC) 40 MG capsule      . Prenatal Vit-Fe Fumarate-FA (PRENATAL MULTIVITAMIN) TABS tablet Take 1 tablet by mouth at bedtime.   03/14/2015 at Unknown time    Review of Systems  Gastrointestinal: Positive for abdominal pain. Negative for constipation, diarrhea, nausea and vomiting.  Genitourinary: Positive for vaginal bleeding. Negative for dysuria.       + vaginal bleeding   Physical Exam   Blood pressure 132/66, pulse 95, temperature 98.1 F (36.7 C), temperature source Oral, resp. rate 18, height 5\' 6"  (1.676 m), weight 247 lb 9.6 oz (112.3 kg), SpO2 99 %, currently breastfeeding.  Physical Exam  Nursing note and vitals reviewed. Constitutional: She is oriented to person, place, and time. She appears well-developed and well-nourished. No distress.  HENT:  Head: Normocephalic and atraumatic.  Eyes: Conjunctivae are normal. Right eye exhibits no discharge. Left eye exhibits no discharge. No scleral icterus.  Neck: Normal range of motion.  Respiratory: Effort normal. No respiratory distress.  Genitourinary: There is bleeding (small amount of dark red blood)  in the vagina.  Genitourinary Comments: Bulging membranes visualized  Neurological: She is alert and oriented to person, place, and time.  Skin: Skin is warm and dry. She is not diaphoretic.  Psychiatric: She has a normal mood and affect. Her behavior is normal. Judgment and thought content normal.    MAU Course  Procedures Results for orders placed or performed during the hospital encounter of 07/29/16 (from the past 24 hour(s))   Urinalysis, Routine w reflex microscopic (not at [facility])     Status: Abnormal   Collection Time: 07/29/16  7:13 AM  Result Value Ref Range   Color, Urine AMBER (A) YELLOW   APPearance HAZY (A) CLEAR   Specific Gravity, Urine 1.015 1.005 - 1.030   pH 6.0 5.0 - 8.0   Glucose, UA NEGATIVE NEGATIVE mg/dL   Hgb urine dipstick LARGE (A) NEGATIVE   Bilirubin Urine NEGATIVE NEGATIVE   Ketones, ur 15 (A) NEGATIVE mg/dL   Protein, ur NEGATIVE NEGATIVE mg/dL   Nitrite NEGATIVE NEGATIVE   Leukocytes, UA NEGATIVE NEGATIVE  Urine microscopic-add on     Status: Abnormal   Collection Time: 07/29/16  7:13 AM  Result Value Ref Range   Squamous Epithelial / LPF 0-5 (A) NONE SEEN   WBC, UA 6-30 0 - 5 WBC/hpf   RBC / HPF TOO NUMEROUS TO COUNT 0 - 5 RBC/hpf   Bacteria, UA NONE SEEN NONE SEEN   Urine-Other MUCOUS PRESENT     MDM FHT 158 by doppler Gentle spec exam performed & showed bulging membranes -- pt placed in trendelenburg & bedside ultrasound ordered Care turned over to [facility] Amanada Philbrick CNM      Judeth HornErin Lawrence, NP 07/29/2016 8:06 AM  Assessment and Plan  Care assumed by me Dr Henderson Cloudomblin contacted Will admit and give antibiotics and maintain Trendelenberg in hopes of possible rescue cerclage Aviva SignsMarie L Karma Ansley, CNM

## 2016-07-30 ENCOUNTER — Inpatient Hospital Stay (HOSPITAL_COMMUNITY): Payer: Managed Care, Other (non HMO)

## 2016-07-30 LAB — CBC
HEMATOCRIT: 27.2 % — AB (ref 36.0–46.0)
Hemoglobin: 9.3 g/dL — ABNORMAL LOW (ref 12.0–15.0)
MCH: 26.1 pg (ref 26.0–34.0)
MCHC: 34.2 g/dL (ref 30.0–36.0)
MCV: 76.2 fL — AB (ref 78.0–100.0)
PLATELETS: 277 10*3/uL (ref 150–400)
RBC: 3.57 MIL/uL — ABNORMAL LOW (ref 3.87–5.11)
RDW: 13.7 % (ref 11.5–15.5)
WBC: 9.9 10*3/uL (ref 4.0–10.5)

## 2016-07-30 MED ORDER — ONDANSETRON HCL 4 MG/2ML IJ SOLN
4.0000 mg | Freq: Four times a day (QID) | INTRAMUSCULAR | Status: DC | PRN
  Administered 2016-07-30: 4 mg via INTRAVENOUS
  Filled 2016-07-30: qty 2

## 2016-07-30 NOTE — Progress Notes (Signed)
CTSP: PROM  Patient had N/V and ruptured with vomiting. No cramping now  VSS Afeb  Uterus NT  SSE-deflated membranes in vagina, Cx appears closed Bedside U/S FHT 160s, scant AF  A/P: PPROM         D/W patient and husband above, reviewed very high possibility of infection, abruption or contractions starting-all of which would lead to delivery. She understands that viability is several weeks away.         We reviewed options of cytotec or expectant management for this evening. She states she wants time to think things over and get MFM consult in am.         Will watch closely for above. CBC and consult ordered for am.

## 2016-07-30 NOTE — Progress Notes (Signed)
18 0/7 weeks  Light pink on tissue with wiping No cramping  VSS afeb Abdomen soft, reports very mild tenderness, no rebound  Results for orders placed or performed during the hospital encounter of 07/29/16 (from the past 24 hour(s))  CBC     Status: Abnormal   Collection Time: 07/30/16  5:18 AM  Result Value Ref Range   WBC 9.9 4.0 - 10.5 K/uL   RBC 3.57 (L) 3.87 - 5.11 MIL/uL   Hemoglobin 9.3 (L) 12.0 - 15.0 g/dL   HCT 16.127.2 (L) 09.636.0 - 04.546.0 %   MCV 76.2 (L) 78.0 - 100.0 fL   MCH 26.1 26.0 - 34.0 pg   MCHC 34.2 30.0 - 36.0 g/dL   RDW 40.913.7 81.111.5 - 91.415.5 %   Platelets 277 150 - 400 K/uL   U/S this am-no change, hour glass BOW in vagina  A/P: Cervical Insufficiency with prolapsed BOW         D/W patient options-she wants to continue pregnancy         MFM consult, CBC in am         Continue BR, ATB

## 2016-07-30 NOTE — Progress Notes (Signed)
Talked with patient and husband about options.  Her husband asked questions about cytotec administration, what happens, etc.  I D/W this with him.  They are considering.

## 2016-07-31 ENCOUNTER — Inpatient Hospital Stay (HOSPITAL_COMMUNITY): Payer: Managed Care, Other (non HMO)

## 2016-07-31 LAB — CBC
HCT: 27.7 % — ABNORMAL LOW (ref 36.0–46.0)
HEMOGLOBIN: 9.5 g/dL — AB (ref 12.0–15.0)
MCH: 26.4 pg (ref 26.0–34.0)
MCHC: 34.3 g/dL (ref 30.0–36.0)
MCV: 76.9 fL — ABNORMAL LOW (ref 78.0–100.0)
PLATELETS: 316 10*3/uL (ref 150–400)
RBC: 3.6 MIL/uL — ABNORMAL LOW (ref 3.87–5.11)
RDW: 13.7 % (ref 11.5–15.5)
WBC: 11 10*3/uL — ABNORMAL HIGH (ref 4.0–10.5)

## 2016-07-31 MED ORDER — MISOPROSTOL 50MCG HALF TABLET
50.0000 ug | ORAL_TABLET | ORAL | Status: DC
  Administered 2016-07-31: 50 ug via VAGINAL
  Filled 2016-07-31 (×2): qty 1
  Filled 2016-07-31: qty 0.5
  Filled 2016-07-31: qty 1

## 2016-07-31 NOTE — Consult Note (Signed)
Maternal Fetal Medicine Consultation  Requesting Provider(s): Tomblin Primary Ob: Tomblin Reason for consultation: PPROM  HPI: The patient is a 35yo female P0121 at 18+1 weeks who was admitted 3 days ago with hourglassing membranes, and eventually had PPROM of clear fluid last night. She has no contractions, fever, chills, or abdominal pain. Up to this point her pregnancy has been unremarkable OB History: OB History    Gravida Para Term Preterm AB Living   4 1 0 1 2 1    SAB TAB Ectopic Multiple Live Births   0 2 0 0 1      PMH:  Past Medical History:  Diagnosis Date  . Preterm labor     PSH:  Past Surgical History:  Procedure Laterality Date  . NO PAST SURGERIES     Meds: amoxicillin, azithromycin Allergies: NKDA Fh: Negative for genetic disease, structural anomalies or mental retardation Soc: Nonsmoker, no tobacco use, denies ETOH or illicit drug use  Review of Systems: no vaginal bleeding or cramping/contractions, no nausea/vomiting. All other systems reviewed and are negative.   PE:  VS: BP        113/64            pulse       85            weight GEN: well-appearing female ABD: gravid, NT  Please see separate document for fetal ultrasound report.  A/P: PPROM at 18+1 weeks  I had an extended discussion with the patient and the father of the baby concerning the clinical situation and options for the pregnancy. We discussed the risks for infection, movement disorders, and pulmonary hypoplasia. I laid out the plans for conservative management if she chose that option (home after 1-2 days, pelvic rest, readmission at 24 weeks, and planned delivery at 34 weeks). Both parents understand the very poor prognosis and the very low chance of having a baby without significant morbidity, and the high risk of postnatal mortality, especially if baby develops pulmonary hypoplasia. I answered all questions. They will discuss amongst themselves what they wish to do, either conservative  treatment or induction. If they opt for conservative treatment I would discontinue amoxicillin after 7 days, and plan US evaluations every 2 weeks, as well as a strong at-home regimen to assess for infection.   Thank you for the opportunity to be a part of the care of [facility]. Please contact our office if we can be of further assistance.   I spent approximately 30 minutes with this patient with over 50% of time spent in face-to-face counseling.

## 2016-07-31 NOTE — Progress Notes (Signed)
[id number]  Earlier today , after discussion w/ MFM, pt + husband had opted for labor induction and to no longer pursue expt mgmt. Because +FHR after PPROM, discussed case with risk mgmt>>>in [place], termination of pregnancy requires informed consent and 72 hour waiting period UNLESS maternal life in imminent danger.  This was explained to pt/husband and they are OK to cont w/ expt mgmt, prob to go home in am, and f/u in office to discuss cytotec induction after the 72 hr window(72 hrs from PPROM up at 10 PM Wed night)

## 2016-07-31 NOTE — Progress Notes (Signed)
RN informed chaplain that patient had declined spiritual care services for the moment.  Met with RNs in [place] room to orient to available resources for memory making.  RN will remind pt that chaplains are here around the clock for emotional, spiritual, and grief support.  Will continue to follow.    Please page as further needs arise.  Donald Prose. Elyn Peers, M.Div. [facility] Chaplain Pager [phone] Office [phone]

## 2016-07-31 NOTE — Progress Notes (Signed)
Dr. Ezzard StandingNewman has been in to speak with patient and explain scenarios. Patient and FOB had no questions. I offered chaplain services. Patient declined. Patient and FOB will discuss and let me know what decision they make.

## 2016-08-01 LAB — CBC
HEMATOCRIT: 27.2 % — AB (ref 36.0–46.0)
HEMOGLOBIN: 9.4 g/dL — AB (ref 12.0–15.0)
MCH: 26.1 pg (ref 26.0–34.0)
MCHC: 34.6 g/dL (ref 30.0–36.0)
MCV: 75.6 fL — AB (ref 78.0–100.0)
Platelets: 304 10*3/uL (ref 150–400)
RBC: 3.6 MIL/uL — AB (ref 3.87–5.11)
RDW: 13.7 % (ref 11.5–15.5)
WBC: 9.3 10*3/uL (ref 4.0–10.5)

## 2016-08-01 MED ORDER — AZITHROMYCIN 250 MG PO TABS: ORAL_TABLET | ORAL | 0 refills | Status: DC

## 2016-08-01 MED ORDER — AMOXICILLIN 500 MG PO CAPS: 500.0000 mg | ORAL_CAPSULE | Freq: Three times a day (TID) | ORAL | 0 refills | Status: DC

## 2016-08-01 NOTE — Lactation Note (Signed)
Lactation Consultation Note  Patient Name: Erika FlavorsJennifer Spayd WUJWJ'XToday's Date: 08/01/2016   Initial consult with mom of fetal demise. Discussed that her body may still make milk despite loss. Lactation After Loss, Hand expression Hand out and Sage Tea Recipe given and discussed, mom denied history of seizure disorder.   Mom has LC phone #. Enc her to call with questions/concens prn.      Maternal Data    Feeding    LATCH Score/Interventions                      Lactation Tools Discussed/Used     Consult Status      Ed BlalockSharon S Lillieanna Tuohy 08/01/2016, 12:44 PM

## 2016-08-01 NOTE — Progress Notes (Signed)
They declined spiritual care yesterday when asked if they wanted to see a chaplain.  I briefly checked in today to see if there was anything we could do to support them before they went home for the next few days.  I had met Aireonna and her SO during a previous pregnancy when their daughter was in the NICU. They were receptive to the visit, but they did not wish to talk extensively today.  They are still very much in shock over the outcome of this pregnancy and stated that they are taking one day at a time.  I let them know of our ongoing availability for support and gave them space to continue to process together.  [place] will plan to follow them to the extent that they feel comfortable and we will work with the staff involved with their care.  Please page [place] when they return to the hospital.  [facility], [place] Pager, [phone] 4:25 PM    08/01/16 1600  Clinical Encounter Type  Visited With Patient and family together  Visit Type Spiritual support  Referral From Nurse  Spiritual Encounters  Spiritual Needs Emotional;Grief support

## 2016-08-01 NOTE — Discharge Summary (Signed)
Physician Discharge Summary  Patient ID: Erika FlavorsJennifer Manning MRN: [id number] DOB/AGE: September 20, 1981 35 y.o.  Admit date: 07/29/2016 Discharge date: 08/01/2016  Admission Diagnoses: Cervical insufficiency, PPROM  Discharge Diagnoses:  Active Problems:   Cervical insufficiency during pregnancy in second trimester, antepartum   Discharged Condition: stable  Hospital Course: Pt was admitted with diagnosis of cervical insufficiency.  During her hospitalization, membranes ruptured.  Pt remained stable with normal WBC and afebrile.  After counseling with MFM, pt and husband elected for termination of pregnancy.  Paperwork signed and pt felt to be stable for discharge and return 72 hours after paperwork signed.  Consults: MFM  Significant Diagnostic Studies: labs: cbc  Treatments: antibiotics: azithromycin and amoxil  Discharge Exam: Blood pressure 118/63, pulse 82, temperature 98.9 F (37.2 C), temperature source Oral, resp. rate 18, height 5\' 6"  (1.676 m), weight 252 lb 9.6 oz (114.6 kg), last menstrual period 03/25/2016, SpO2 100 %, currently breastfeeding. General appearance: alert and cooperative GI: normal findings: soft, non-tender ext - NT, no edema  Disposition: 01-Home or Self Care     Medication List    TAKE these medications   acidophilus Caps capsule Take 1 capsule by mouth daily.   amoxicillin 500 MG capsule Commonly known as:  AMOXIL Take 1 capsule (500 mg total) by mouth every 8 (eight) hours.   azithromycin 250 MG tablet Commonly known as:  ZITHROMAX 2 po qd   cholecalciferol 1000 units tablet Commonly known as:  VITAMIN D Take 1,000 Units by mouth daily.   omeprazole 20 MG capsule Commonly known as:  PRILOSEC Take 20 mg by mouth daily.   prenatal multivitamin Tabs tablet Take 1 tablet by mouth daily.      Follow-up Information    MORRIS, MEGAN, DO. Schedule an appointment as soon as possible for a visit in 1 day(s).   Specialty:  Obstetrics and  Gynecology Contact information: [address], [address], [address] [place] [address] [phone]           Signed: Zelphia CairoDKINS,Lovinia Snare 08/01/2016, 8:47 AM

## 2016-08-01 NOTE — Progress Notes (Signed)
Discharge instructions reviewed with patient and husband. Verbalized an understanding of discharge instructions.

## 2016-08-02 ENCOUNTER — Observation Stay (HOSPITAL_COMMUNITY)
Admission: AD | Admit: 2016-08-02 | Discharge: 2016-08-03 | Disposition: A | Discharge status: Home / Self Care | Payer: Managed Care, Other (non HMO) | Source: Ambulatory Visit | Attending: Obstetrics & Gynecology | Admitting: Obstetrics & Gynecology

## 2016-08-02 ENCOUNTER — Inpatient Hospital Stay (HOSPITAL_COMMUNITY): Payer: Managed Care, Other (non HMO)

## 2016-08-02 DIAGNOSIS — O321XX0 Maternal care for breech presentation, not applicable or unspecified: Secondary | ICD-10-CM | POA: Insufficient documentation

## 2016-08-02 DIAGNOSIS — O09292 Supervision of pregnancy with other poor reproductive or obstetric history, second trimester: Secondary | ICD-10-CM | POA: Insufficient documentation

## 2016-08-02 DIAGNOSIS — O42912 Preterm premature rupture of membranes, unspecified as to length of time between rupture and onset of labor, second trimester: Secondary | ICD-10-CM

## 2016-08-02 DIAGNOSIS — Z3A18 18 weeks gestation of pregnancy: Secondary | ICD-10-CM | POA: Insufficient documentation

## 2016-08-02 DIAGNOSIS — O42919 Preterm premature rupture of membranes, unspecified as to length of time between rupture and onset of labor, unspecified trimester: Secondary | ICD-10-CM

## 2016-08-02 MED ORDER — ACETAMINOPHEN 325 MG PO TABS: 650.0000 mg | ORAL_TABLET | ORAL | Status: DC | PRN

## 2016-08-02 MED ORDER — LIDOCAINE HCL (PF) 1 % IJ SOLN: 30.0000 mL | INTRAMUSCULAR | Status: DC | PRN

## 2016-08-02 MED ORDER — OXYCODONE-ACETAMINOPHEN 5-325 MG PO TABS: 1.0000 | ORAL_TABLET | ORAL | Status: DC | PRN

## 2016-08-02 MED ORDER — ONDANSETRON HCL 4 MG/2ML IJ SOLN: 4.0000 mg | Freq: Four times a day (QID) | INTRAMUSCULAR | Status: DC | PRN

## 2016-08-02 MED ORDER — SOD CITRATE-CITRIC ACID 500-334 MG/5ML PO SOLN: 30.0000 mL | ORAL | Status: DC | PRN

## 2016-08-02 MED ORDER — MISOPROSTOL 200 MCG PO TABS
200.0000 ug | ORAL_TABLET | ORAL | Status: DC | PRN
  Filled 2016-08-02: qty 1

## 2016-08-02 MED ORDER — FLEET ENEMA 7-19 GM/118ML RE ENEM: 1.0000 | ENEMA | RECTAL | Status: DC | PRN

## 2016-08-02 MED ORDER — OXYCODONE-ACETAMINOPHEN 5-325 MG PO TABS: 2.0000 | ORAL_TABLET | ORAL | Status: DC | PRN

## 2016-08-02 MED ORDER — LACTATED RINGERS IV SOLN: INTRAVENOUS | Status: DC

## 2016-08-02 MED ORDER — LACTATED RINGERS IV SOLN: 500.0000 mL | INTRAVENOUS | Status: DC | PRN

## 2016-08-02 MED ORDER — OXYTOCIN 40 UNITS IN LACTATED RINGERS INFUSION - SIMPLE MED: 2.5000 [IU]/h | INTRAVENOUS | Status: DC

## 2016-08-02 MED ORDER — OXYTOCIN BOLUS FROM INFUSION: 500.0000 mL | Freq: Once | INTRAVENOUS | Status: DC

## 2016-08-03 ENCOUNTER — Encounter (HOSPITAL_COMMUNITY): Payer: Self-pay

## 2016-08-03 NOTE — Progress Notes (Signed)
Patient requested to speak with Dr. Langston MaskerMorris to decide if she wanted to be admitted and follow through with induction. Dr. Langston MaskerMorris ordered an ultrasound and care would be determined based on results.

## 2016-08-03 NOTE — Discharge Instructions (Signed)
Call MD for T>100.4, heavy vaginal bleeding, abdominal pain, or respiratory distress.  Monitor closely for signs of infection and check temperature daily.  Follow up in office in 1 week.  Pelvic rest.

## 2016-08-03 NOTE — H&P (Addendum)
Erika FlavorsJennifer Manning is a 35 y.o. female presenting for induction of labor/termination of pregnany; PPROM at 18 weeks.  The patient was admitted on 10/21 with cervical insufficiency.  On HD2, the patient had PPROM with coughing.  She was started on amp/azith and discharged home yesterday with plans to return in 72 hours for termination per [place] law.  She presented tonight and requested an ultrasound to confirm absence of fluid as she has read about membranes resealing.  Ultrasound showed oligohydramnios with largest pocket 16mm per radiologist; final report pending.  The patient reports scant if any leakage of fluid after discharge and no abnormal vaginal discharge.  She has felt no abdominal pain and had no VB.  No fever or chills.  Antepartum course otherwise uncomplicated.  H/O PPROM at 23 weeks with delivery at 1828 in previous pregnancy.  OB History    Gravida Para Term Preterm AB Living   4 1 0 1 2 1    SAB TAB Ectopic Multiple Live Births   0 2 0 0 1     Past Medical History:  Diagnosis Date  . Preterm labor    Past Surgical History:  Procedure Laterality Date  . NO PAST SURGERIES     Family History: family history includes Diabetes in her maternal grandmother; Hypertension in her mother. Social History:  reports that she has never smoked. She has never used smokeless tobacco. She reports that she does not drink alcohol or use drugs.     Maternal Diabetes: No Genetic Screening: Normal Maternal Ultrasounds/Referrals: Abnormal:  Findings:   Other: oligohydramnios Fetal Ultrasounds or other Referrals:  Referred to Materal Fetal Medicine  Maternal Substance Abuse:  No Significant Maternal Medications:  None Significant Maternal Lab Results:  None Other Comments:  None  Review of Systems  Constitutional: Negative for chills and fever.  Genitourinary: Negative for dysuria.  Musculoskeletal: Negative for myalgias.   Maternal Medical History:  Reason for admission: Rupture of membranes.    Contractions: Onset was more than 2 days ago.    Prenatal Complications - Diabetes: none.      Blood pressure 130/66, pulse 91, temperature 98.3 F (36.8 C), temperature source Oral, resp. rate 18, height 5\' 6"  (1.676 m), weight 252 lb (114.3 kg), last menstrual period 03/25/2016, currently breastfeeding. Maternal Exam:  Abdomen: Patient reports no abdominal tenderness.   Physical Exam  Constitutional: She is oriented to person, place, and time. She appears well-developed and well-nourished.  GI: Soft. There is no rebound and no guarding.  Neurological: She is alert and oriented to person, place, and time.  Skin: Skin is warm and dry.  Psychiatric: She has a normal mood and affect. Her behavior is normal.    Prenatal labs: ABO, Rh: --/--/O POS (10/21 0800) Antibody: NEG (10/21 0800) Rubella: Immune (08/10 0000) RPR: Nonreactive (08/10 0000)  HBsAg: Negative (08/10 0000)  HIV: Non-reactive (08/10 0000)  GBS:     Assessment/Plan: [name] at 730w4d with PPROM, oligohydramnios -The patient has been extensively counseled re: the gravity of PPROM on the long-term outcome of this pregnancy.  The patient elects to continue pregnancy at this time with close follow up in the office.  Will discharge. -Monitor temperature daily -Weekly office visits to assess for infection -Rpt u/s prn or Q 2 weeks -Plan steroids and in-patient management at viability -Continue 7 days of amp/zith  Erika Manning 08/03/2016, 3:19 AM

## 2016-08-06 ENCOUNTER — Encounter (HOSPITAL_COMMUNITY): Payer: Self-pay | Admitting: Anesthesiology

## 2016-08-06 ENCOUNTER — Encounter (HOSPITAL_COMMUNITY): Payer: Self-pay | Admitting: Certified Nurse Midwife

## 2016-08-06 ENCOUNTER — Inpatient Hospital Stay (HOSPITAL_COMMUNITY)
Admission: AD | Admit: 2016-08-06 | Discharge: 2016-08-06 | DRG: 775 | Disposition: A | Discharge status: Home / Self Care | Payer: Managed Care, Other (non HMO) | Source: Ambulatory Visit | Attending: Obstetrics and Gynecology | Admitting: Obstetrics and Gynecology

## 2016-08-06 DIAGNOSIS — N883 Incompetence of cervix uteri: Secondary | ICD-10-CM | POA: Diagnosis present

## 2016-08-06 DIAGNOSIS — O039 Complete or unspecified spontaneous abortion without complication: Secondary | ICD-10-CM

## 2016-08-06 DIAGNOSIS — Z3A18 18 weeks gestation of pregnancy: Secondary | ICD-10-CM | POA: Diagnosis not present

## 2016-08-06 DIAGNOSIS — O42112 Preterm premature rupture of membranes, onset of labor more than 24 hours following rupture, second trimester: Secondary | ICD-10-CM | POA: Diagnosis present

## 2016-08-06 LAB — CBC WITH DIFFERENTIAL/PLATELET
BASOS ABS: 0 10*3/uL (ref 0.0–0.1)
BASOS PCT: 0 %
Basophils Absolute: 0 10*3/uL (ref 0.0–0.1)
Basophils Relative: 0 %
EOS PCT: 0 %
Eosinophils Absolute: 0.1 10*3/uL (ref 0.0–0.7)
Eosinophils Absolute: 0 10*3/uL (ref 0.0–0.7)
Eosinophils Relative: 1 %
HCT: 27 % — ABNORMAL LOW (ref 36.0–46.0)
HEMATOCRIT: 32.4 % — AB (ref 36.0–46.0)
HEMOGLOBIN: 11.2 g/dL — AB (ref 12.0–15.0)
HEMOGLOBIN: 9.6 g/dL — AB (ref 12.0–15.0)
LYMPHS ABS: 2.1 10*3/uL (ref 0.7–4.0)
LYMPHS ABS: 1.2 10*3/uL (ref 0.7–4.0)
LYMPHS PCT: 7 %
Lymphocytes Relative: 15 %
MCH: 26.1 pg (ref 26.0–34.0)
MCH: 26.8 pg (ref 26.0–34.0)
MCHC: 34.6 g/dL (ref 30.0–36.0)
MCHC: 35.6 g/dL (ref 30.0–36.0)
MCV: 75.5 fL — ABNORMAL LOW (ref 78.0–100.0)
MCV: 75.4 fL — ABNORMAL LOW (ref 78.0–100.0)
MONO ABS: 0.3 10*3/uL (ref 0.1–1.0)
MONOS PCT: 4 %
Monocytes Absolute: 0.6 10*3/uL (ref 0.1–1.0)
Monocytes Relative: 2 %
NEUTROS ABS: 11.1 10*3/uL — AB (ref 1.7–7.7)
NEUTROS PCT: 80 %
NEUTROS PCT: 91 %
Neutro Abs: 15.6 10*3/uL — ABNORMAL HIGH (ref 1.7–7.7)
Other: 0 %
PLATELETS: 287 10*3/uL (ref 150–400)
Platelets: 318 10*3/uL (ref 150–400)
RBC: 4.29 MIL/uL (ref 3.87–5.11)
RBC: 3.58 MIL/uL — AB (ref 3.87–5.11)
RDW: 13.6 % (ref 11.5–15.5)
RDW: 13.5 % (ref 11.5–15.5)
WBC: 14 10*3/uL — AB (ref 4.0–10.5)
WBC: 17.1 10*3/uL — AB (ref 4.0–10.5)

## 2016-08-06 LAB — TYPE AND SCREEN
ABO/RH(D): O POS
ANTIBODY SCREEN: NEGATIVE

## 2016-08-06 MED ORDER — LACTATED RINGERS IV BOLUS (SEPSIS)
1000.0000 mL | Freq: Once | INTRAVENOUS | Status: AC
  Administered 2016-08-06: 1000 mL via INTRAVENOUS

## 2016-08-06 MED ORDER — CEFAZOLIN SODIUM-DEXTROSE 2-4 GM/100ML-% IV SOLN
2.0000 g | Freq: Once | INTRAVENOUS | Status: AC
  Administered 2016-08-06: 2 g via INTRAVENOUS
  Filled 2016-08-06: qty 100

## 2016-08-06 MED ORDER — IBUPROFEN 600 MG PO TABS: 600.0000 mg | ORAL_TABLET | Freq: Four times a day (QID) | ORAL | 0 refills | Status: DC | PRN

## 2016-08-06 MED ORDER — LACTATED RINGERS IV SOLN: INTRAVENOUS | Status: DC

## 2016-08-06 MED ORDER — SOD CITRATE-CITRIC ACID 500-334 MG/5ML PO SOLN: 30.0000 mL | ORAL | Status: DC | PRN

## 2016-08-06 MED ORDER — FLEET ENEMA 7-19 GM/118ML RE ENEM: 1.0000 | ENEMA | RECTAL | Status: DC | PRN

## 2016-08-06 MED ORDER — OXYCODONE-ACETAMINOPHEN 5-325 MG PO TABS: 1.0000 | ORAL_TABLET | ORAL | Status: DC | PRN

## 2016-08-06 MED ORDER — OXYCODONE-ACETAMINOPHEN 5-325 MG PO TABS: 1.0000 | ORAL_TABLET | Freq: Four times a day (QID) | ORAL | 0 refills | Status: DC | PRN

## 2016-08-06 MED ORDER — OXYCODONE-ACETAMINOPHEN 5-325 MG PO TABS: 2.0000 | ORAL_TABLET | ORAL | Status: DC | PRN

## 2016-08-06 MED ORDER — FENTANYL CITRATE (PF) 100 MCG/2ML IJ SOLN
100.0000 ug | Freq: Once | INTRAMUSCULAR | Status: AC
  Administered 2016-08-06: 100 ug via INTRAVENOUS

## 2016-08-06 MED ORDER — LACTATED RINGERS IV BOLUS (SEPSIS)
500.0000 mL | Freq: Once | INTRAVENOUS | Status: AC
  Administered 2016-08-06: 500 mL via INTRAVENOUS

## 2016-08-06 MED ORDER — MISOPROSTOL 200 MCG PO TABS
ORAL_TABLET | ORAL | Status: AC
  Filled 2016-08-06: qty 4

## 2016-08-06 MED ORDER — KETOROLAC TROMETHAMINE 30 MG/ML IJ SOLN
30.0000 mg | Freq: Once | INTRAMUSCULAR | Status: AC
  Administered 2016-08-06: 30 mg via INTRAVENOUS
  Filled 2016-08-06: qty 1

## 2016-08-06 MED ORDER — ACETAMINOPHEN 325 MG PO TABS: 650.0000 mg | ORAL_TABLET | ORAL | Status: DC | PRN

## 2016-08-06 MED ORDER — LIDOCAINE HCL (PF) 1 % IJ SOLN: 30.0000 mL | INTRAMUSCULAR | Status: DC | PRN

## 2016-08-06 MED ORDER — OXYCODONE-ACETAMINOPHEN 5-325 MG PO TABS
2.0000 | ORAL_TABLET | Freq: Once | ORAL | Status: AC
  Administered 2016-08-06: 2 via ORAL
  Filled 2016-08-06: qty 2

## 2016-08-06 MED ORDER — OXYTOCIN 40 UNITS IN LACTATED RINGERS INFUSION - SIMPLE MED
INTRAVENOUS | Status: AC
  Administered 2016-08-06: 06:00:00
  Filled 2016-08-06: qty 1000

## 2016-08-06 MED ORDER — FENTANYL CITRATE (PF) 100 MCG/2ML IJ SOLN
INTRAMUSCULAR | Status: AC
  Administered 2016-08-06: 100 ug via INTRAVENOUS
  Filled 2016-08-06: qty 2

## 2016-08-06 MED ORDER — LACTATED RINGERS IV SOLN: 500.0000 mL | INTRAVENOUS | Status: DC | PRN

## 2016-08-06 MED ORDER — OXYTOCIN 40 UNITS IN LACTATED RINGERS INFUSION - SIMPLE MED: 2.5000 [IU]/h | INTRAVENOUS | Status: DC

## 2016-08-06 MED ORDER — ONDANSETRON HCL 4 MG/2ML IJ SOLN: 4.0000 mg | Freq: Four times a day (QID) | INTRAMUSCULAR | Status: DC | PRN

## 2016-08-06 MED ORDER — OXYTOCIN BOLUS FROM INFUSION: 500.0000 mL | Freq: Once | INTRAVENOUS | Status: DC

## 2016-08-06 NOTE — Anesthesia Preprocedure Evaluation (Deleted)
Anesthesia Evaluation  Patient identified by MRN, date of birth, ID band Patient awake    Reviewed: Allergy & Precautions, NPO status , Patient's Chart, lab work & pertinent test results  Airway Mallampati: II  TM Distance: >3 FB Neck ROM: Full    Dental no notable dental hx.    Pulmonary neg pulmonary ROS,    Pulmonary exam normal breath sounds clear to auscultation       Cardiovascular negative cardio ROS Normal cardiovascular exam Rhythm:Regular Rate:Normal     Neuro/Psych negative neurological ROS  negative psych ROS   GI/Hepatic negative GI ROS, Neg liver ROS,   Endo/Other  Morbid obesity  Renal/GU negative Renal ROS  negative genitourinary   Musculoskeletal negative musculoskeletal ROS (+)   Abdominal (+) + obese,   Peds negative pediatric ROS (+)  Hematology negative hematology ROS (+)   Anesthesia Other Findings   Reproductive/Obstetrics negative OB ROS                             Anesthesia Physical Anesthesia Plan  ASA: III  Anesthesia Plan: Epidural   Post-op Pain Management:    Induction:   Airway Management Planned: Natural Airway  Additional Equipment:   Intra-op Plan:   Post-operative Plan:   Informed Consent: I have reviewed the patients History and Physical, chart, labs and discussed the procedure including the risks, benefits and alternatives for the proposed anesthesia with the patient or authorized representative who has indicated his/her understanding and acceptance.     Plan Discussed with: CRNA  Anesthesia Plan Comments: (Informed consent obtained prior to proceeding including risk of failure, 1% risk of PDPH, risk of minor discomfort and bruising.  Discussed rare but serious complications including epidural abscess, permanent nerve injury, epidural hematoma.  Discussed alternatives to epidural analgesia and patient desires to proceed.  Timeout  performed pre-procedure verifying patient name, procedure, and platelet count.  Patient tolerated procedure well.)        Anesthesia Quick Evaluation

## 2016-08-06 NOTE — MAU Provider Note (Signed)
Chief Complaint: Contractions   First Provider Initiated Contact with Patient 08/06/16 0541     SUBJECTIVE HPI: Erika Manning is a 35 y.o. [id number] at 7379w0d w/ history of incompetent cervix and PPPROM at 18.0 weeks who presents to Maternity Admissions reporting pelvic pressure and vaginal bleeding.   Location: Low abdomen, pelvis Quality: Cramping, pressure Severity: Moderate-severe Duration: Immediately before coming to MAU Timing: Intermittent Associated signs and symptoms: Pos for fever and chills, light vaginal bleeding.   Past Medical History:  Diagnosis Date  . Preterm labor    OB History  Gravida Para Term Preterm AB Living  4 1 0 1 2 1   SAB TAB Ectopic Multiple Live Births  0 2 0 0 1    # Outcome Date GA Lbr Len/2nd Weight Sex Delivery Anes PTL Lv  4 Current           3 Preterm 02/24/15 3770w1d 04:30 / 00:10 3 lb (1.36 kg) F Vag-Spont EPI  LIV  2 TAB           1 TAB              Past Surgical History:  Procedure Laterality Date  . NO PAST SURGERIES     Social History   Social History  . Marital status: Married    Spouse name: N/A  . Number of children: N/A  . Years of education: N/A   Occupational History  . Not on file.   Social History Main Topics  . Smoking status: Never Smoker  . Smokeless tobacco: Never Used  . Alcohol use No  . Drug use: No  . Sexual activity: Yes   Other Topics Concern  . Not on file   Social History Narrative  . No narrative on file   Family History  Problem Relation Age of Onset  . Hypertension Mother   . Diabetes Maternal Grandmother    No current facility-administered medications on file prior to encounter.    Current Outpatient Prescriptions on File Prior to Encounter  Medication Sig Dispense Refill  . acidophilus (RISAQUAD) CAPS capsule Take 1 capsule by mouth daily.    Marland Kitchen. amoxicillin (AMOXIL) 500 MG capsule Take 1 capsule (500 mg total) by mouth every 8 (eight) hours. 6 capsule 0  . azithromycin (ZITHROMAX)  250 MG tablet 2 po qd 6 each 0  . cholecalciferol (VITAMIN D) 1000 units tablet Take 1,000 Units by mouth daily.    Marland Kitchen. omeprazole (PRILOSEC) 20 MG capsule Take 20 mg by mouth daily.    . Prenatal Vit-Fe Fumarate-FA (PRENATAL MULTIVITAMIN) TABS tablet Take 1 tablet by mouth daily.      No Known Allergies  I have reviewed patient's Past Medical Hx, Surgical Hx, Family Hx, Social Hx, medications and allergies.   Review of Systems  Constitutional: Positive for chills and fever.  Gastrointestinal: Positive for abdominal pain.  Genitourinary: Positive for vaginal bleeding.    OBJECTIVE Patient Vitals for the past 24 hrs:  BP Temp Temp src Pulse Resp SpO2  08/06/16 0531 123/55 98.5 F (36.9 C) Oral 108 18 99 %   Constitutional: Well-developed, well-nourished female in moderate distress.  Cardiovascular: mildly tachycardic Respiratory: normal rate and effort.  GI: Abd soft, mildly tender, gravid appropriate for gestational age.  MS: Extremities nontender, no edema, normal ROM Neurologic: Alert and oriented x 4.  GU: Legs in vagina  LAB RESULTS Results for orders placed or performed during the hospital encounter of 08/06/16 (from the past 24 hour(s))  CBC with  Differential     Status: Abnormal   Collection Time: 08/06/16  5:20 AM  Result Value Ref Range   WBC 14.0 (H) 4.0 - 10.5 K/uL   RBC 4.29 3.87 - 5.11 MIL/uL   Hemoglobin 11.2 (L) 12.0 - 15.0 g/dL   HCT 69.632.4 (L) 29.536.0 - 28.446.0 %   MCV 75.5 (L) 78.0 - 100.0 fL   MCH 26.1 26.0 - 34.0 pg   MCHC 34.6 30.0 - 36.0 g/dL   RDW 13.213.6 44.011.5 - 10.215.5 %   Platelets 318 150 - 400 K/uL   Neutrophils Relative % 80 %   Neutro Abs 11.1 (H) 1.7 - 7.7 K/uL   Lymphocytes Relative 15 %   Lymphs Abs 2.1 0.7 - 4.0 K/uL   Monocytes Relative 4 %   Monocytes Absolute 0.6 0.1 - 1.0 K/uL   Eosinophils Relative 1 %   Eosinophils Absolute 0.1 0.0 - 0.7 K/uL   Basophils Relative 0 %   Basophils Absolute 0.0 0.0 - 0.1 K/uL     MAU COURSE Orders Placed  This Encounter  Procedures  . CBC with Differential  . Type and screen [facility]  . Insert peripheral IV   Meds ordered this encounter  Medications  . fentaNYL (SUBLIMAZE) injection 100 mcg  . lactated ringers bolus 500 mL  . fentaNYL (SUBLIMAZE) 100 MCG/2ML injection    Steward Droneogers, Veronica   : cabinet override    MDM - PPPROM w/ delivery imminent  - Concern for infection due to report of fever and chills at home, but afebrile in MAU w/out malodorous fluid. Mild leukocytosis at 14.   ASSESSMENT 1. Preterm premature rupture of membranes (PPROM) with onset of labor after 24 hours of rupture in second trimester, antepartum     PLAN Admit to L&D per consult w/ Dr Marcelle OverlieHolland  Dorathy KinsmanVirginia Juleah Paradise, CNM 08/06/2016  5:44 AM

## 2016-08-06 NOTE — Progress Notes (Signed)
Pt known to me, 2763w0d w/ hx PPROM>>>was hospt last wk for IV ABX, sent home on PO Amox for expect mgmt>>>presents to MAU bearing down>>>prompt del of nl appearing fetus, followed by intact placenta. EBL 400 cc  Received 1 dose IV narcotic for pain mgmt. Ancef 2 grams IV X 1, will recheck CBC 2 hrs and obsv for bleeding, Currently, uterus  firm w/ min bleeding, pt/husband decline autopsy , she already had Panorama and declines other genetic screening, will send plac to path  Will D/C from MAU if stable, w/ f/u this week in office

## 2016-08-06 NOTE — Progress Notes (Signed)
[facility] Baby quickly delivered breech presentation. FHR ~40 at delivery, but no HR at 1 minute. Cord clamped and cut x 2. Infant handed to parents who were appropriately tearful. Pitocin bolus infusing. One moderate gush of blood and clots while awaiting delivery of placenta. Placenta delivered w/ gentle cord traction intact. Scant bleeding to follow. EBL 400 ml.   Dr Marcelle OverlieHolland arrived in room and assumed care of pt. See note.   ArnoldVirginia Kazuma Elena, CNM 08/06/2016 7:04 AM

## 2016-08-06 NOTE — MAU Note (Signed)
Patient taken to bathroom in wheelchair voided quantity sufficient.

## 2016-08-06 NOTE — MAU Note (Signed)
Having pressure and contractions.  Water broke at 18 weeks and been home on bedrest. Having small amount of blood tonight.

## 2016-08-06 NOTE — Discharge Instructions (Signed)
°Return to care  °· If you have heavier bleeding that soaks through more that 2 pads per hour for an hour or more °· If you bleed so much that you feel like you might pass out or you do pass out °· If you have significant abdominal pain that is not improved with Tylenol  °· If you develop a fever > 100.5 ° ° ° ° °Miscarriage °A miscarriage is the sudden loss of an unborn baby (fetus) before the 20th week of pregnancy. Most miscarriages happen in the first 3 months of pregnancy. Sometimes, it happens before a woman even knows she is pregnant. A miscarriage is also called a "spontaneous miscarriage" or "early pregnancy loss." Having a miscarriage can be an emotional experience. Talk with your caregiver about any questions you may have about miscarrying, the grieving process, and your future pregnancy plans. °CAUSES  °· Problems with the fetal chromosomes that make it impossible for the baby to develop normally. Problems with the baby's genes or chromosomes are most often the result of errors that occur, by chance, as the embryo divides and grows. The problems are not inherited from the parents. °· Infection of the cervix or uterus.   °· Hormone problems.   °· Problems with the cervix, such as having an incompetent cervix. This is when the tissue in the cervix is not strong enough to hold the pregnancy.   °· Problems with the uterus, such as an abnormally shaped uterus, uterine fibroids, or congenital abnormalities.   °· Certain medical conditions.   °· Smoking, drinking alcohol, or taking illegal drugs.   °· Trauma.   °Often, the cause of a miscarriage is unknown.  °SYMPTOMS  °· Vaginal bleeding or spotting, with or without cramps or pain. °· Pain or cramping in the abdomen or lower back. °· Passing fluid, tissue, or blood clots from the vagina. °DIAGNOSIS  °Your caregiver will perform a physical exam. You may also have an ultrasound to confirm the miscarriage. Blood or urine tests may also be ordered. °TREATMENT   °· Sometimes, treatment is not necessary if you naturally pass all the fetal tissue that was in the uterus. If some of the fetus or placenta remains in the body (incomplete miscarriage), tissue left behind may become infected and must be removed. Usually, a dilation and curettage (D and C) procedure is performed. During a D and C procedure, the cervix is widened (dilated) and any remaining fetal or placental tissue is gently removed from the uterus. °· Antibiotic medicines are prescribed if there is an infection. Other medicines may be given to reduce the size of the uterus (contract) if there is a lot of bleeding. °· If you have Rh negative blood and your baby was Rh positive, you will need a Rh immunoglobulin shot. This shot will protect any future baby from having Rh blood problems in future pregnancies. °HOME CARE INSTRUCTIONS  °· Your caregiver may order bed rest or may allow you to continue light activity. Resume activity as directed by your caregiver. °· Have someone help with home and family responsibilities during this time.   °· Keep track of the number of sanitary pads you use each day and how soaked (saturated) they are. Write down this information.   °· Do not use tampons. Do not douche or have sexual intercourse until approved by your caregiver.   °· Only take over-the-counter or prescription medicines for pain or discomfort as directed by your caregiver.   °· Do not take aspirin. Aspirin can cause bleeding.   °· Keep all follow-up appointments with   your caregiver.   °· If you or your partner have problems with grieving, talk to your caregiver or seek counseling to help cope with the pregnancy loss. Allow enough time to grieve before trying to get pregnant again.   °SEEK IMMEDIATE MEDICAL CARE IF:  °· You have severe cramps or pain in your back or abdomen. °· You have a fever. °· You pass large blood clots (walnut-sized or larger) or tissue from your vagina. Save any tissue for your caregiver to  inspect.   °· Your bleeding increases.   °· You have a thick, bad-smelling vaginal discharge. °· You become lightheaded, weak, or you faint.   °· You have chills.   °MAKE SURE YOU: °· Understand these instructions. °· Will watch your condition. °· Will get help right away if you are not doing well or get worse. °  °This information is not intended to replace advice given to you by your health care provider. Make sure you discuss any questions you have with your health care provider. °  °Document Released: 03/21/2001 Document Revised: 01/20/2013 Document Reviewed: 11/14/2011 °Elsevier Interactive Patient Education ©2016 [employer] ° °

## 2016-08-06 NOTE — Progress Notes (Signed)
Pt was lying in bed and in apparent pain when I arrived. Her husband was bedside and confirmed she was hurting. Nurse came to attend. Pt's husband said their wanted prayer over their baby whom they name Faith. Pt's husband and I stood by fetal remains and had prayer. Pt's husband was very tearful concerning their loss. Pt and husband were appropriately grieving. Please page if additional support is needed. Chaplain Marjory LiesPamela Carrington Holder, M.Div.   08/06/16 0700  Clinical Encounter Type  Visited With Patient and family together

## 2016-08-11 NOTE — Discharge Summary (Signed)
Obstetric Discharge Summary Reason for Admission: observation/evaluation Prenatal Procedures: ultrasound Intrapartum Procedures: na Postpartum Procedures: na Complications-Operative and Postpartum: na Hemoglobin  Date Value Ref Range Status  08/06/2016 9.6 (L) 12.0 - 15.0 g/dL Final   HCT  Date Value Ref Range Status  08/06/2016 27.0 (L) 36.0 - 46.0 % Final    Physical Exam:  General: alert and cooperative Lochia: na Uterine Fundus: gravid Incision: na DVT Evaluation: No evidence of DVT seen on physical exam. Negative Homan's sign. No cords or calf tenderness. No significant calf/ankle edema.  Discharge Diagnoses: PPROM, oligohydraminios, 18 4/7 weeks  Discharge Information: Date: 08/11/2016 Activity: pelvic rest Diet: routine Medications: PNV and ampicillin and azithromycin Condition: stable Instructions: refer to practice specific booklet and daily temps, schedule weekly office visits, plan u/s in 2 weeks. continue antibiotics Discharge to: home Follow-up Information    [facility]. Schedule an appointment as soon as possible for a visit in 3 day(s).   Why:  Call office tomorrow morning for to schedule follow up appointment later this week Contact information: [address] [place] [address] [phone]        [facility] ADMISSIONS .   Contact information: [address] [id number] mc [place] [id number] [phone]          Newborn Data:  Judith BlonderCURTIS,Nazar Kuan G 08/11/2016, 8:52 AM

## 2016-08-24 ENCOUNTER — Telehealth (HOSPITAL_COMMUNITY): Payer: Self-pay

## 2017-02-06 IMAGING — US US OB COMP +14 WK
1 series · 12 of 28 positions shown · non-contrast
Comparison: none

[Series 1: us ob +14 all · 12 of 65 slices shown]
[im 3/65]
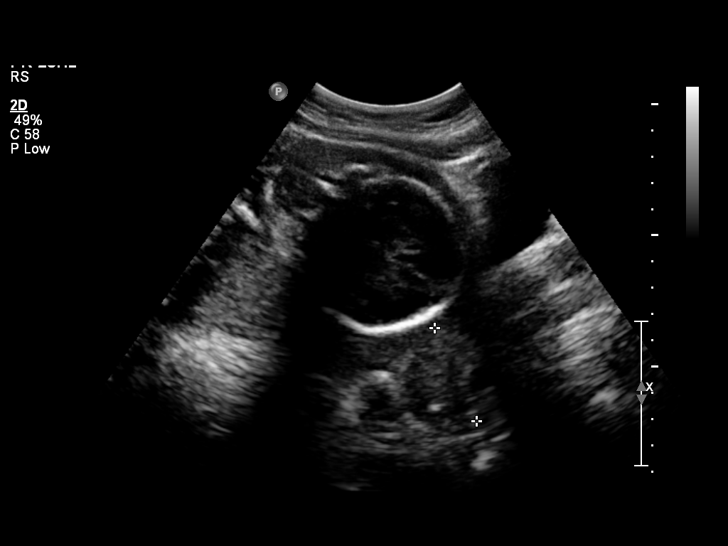
[im 8/65]
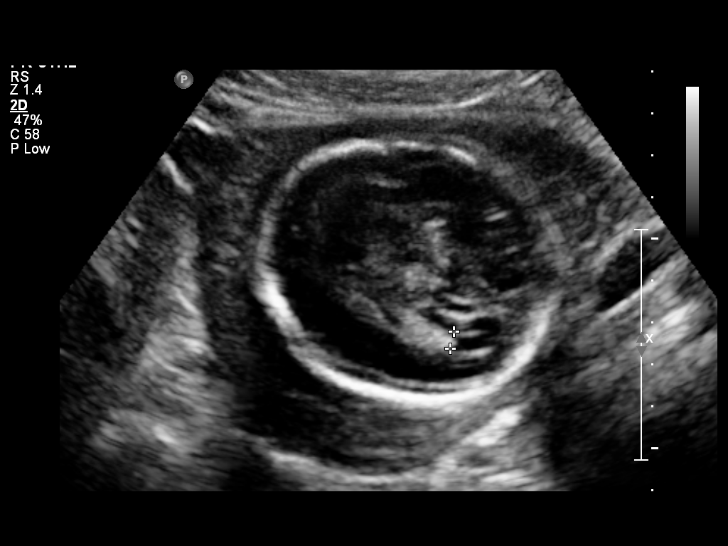
[im 12/65]
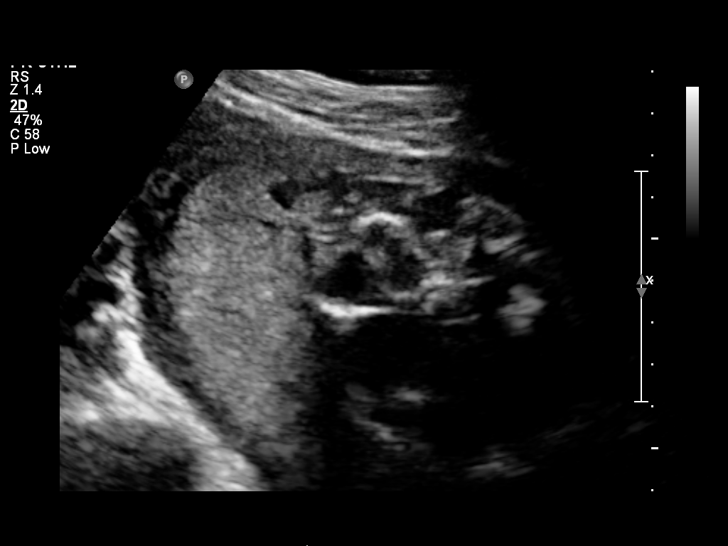
[im 19/65]
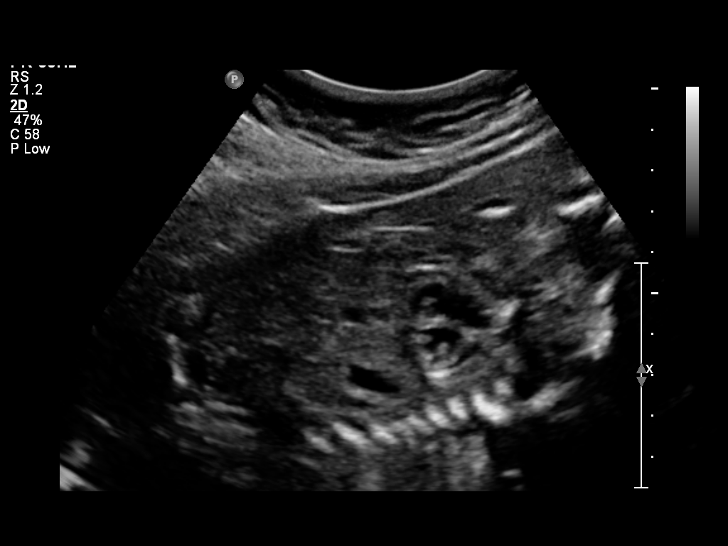
[im 24/65]
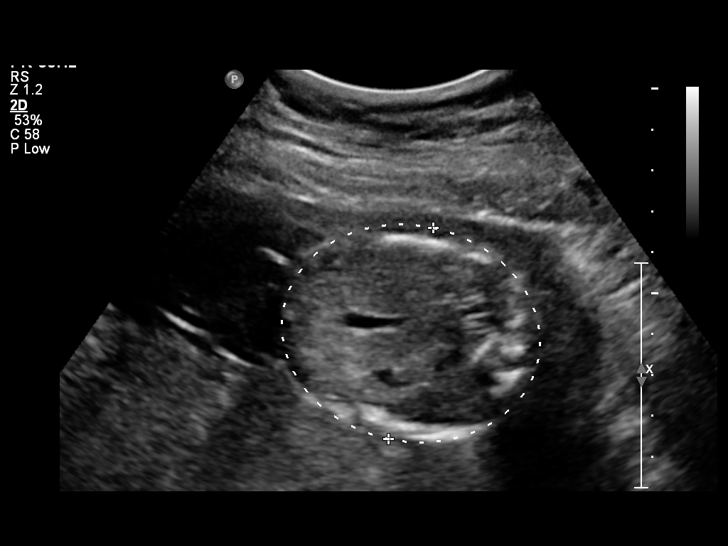
[im 29/65]
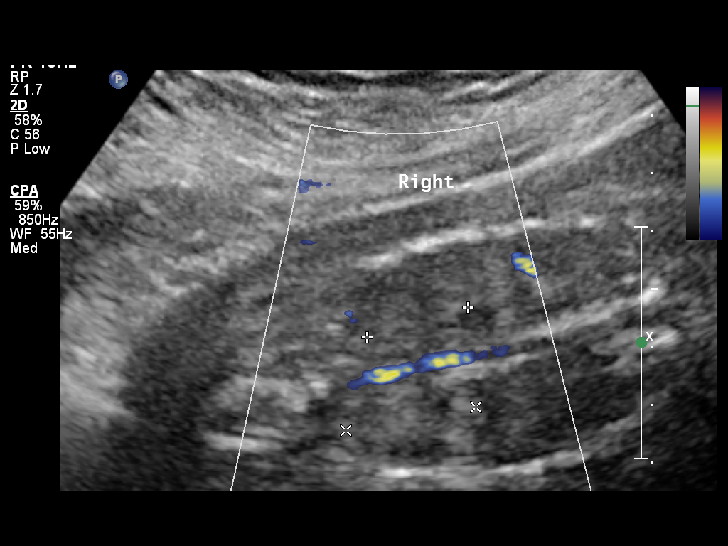
[im 36/65]
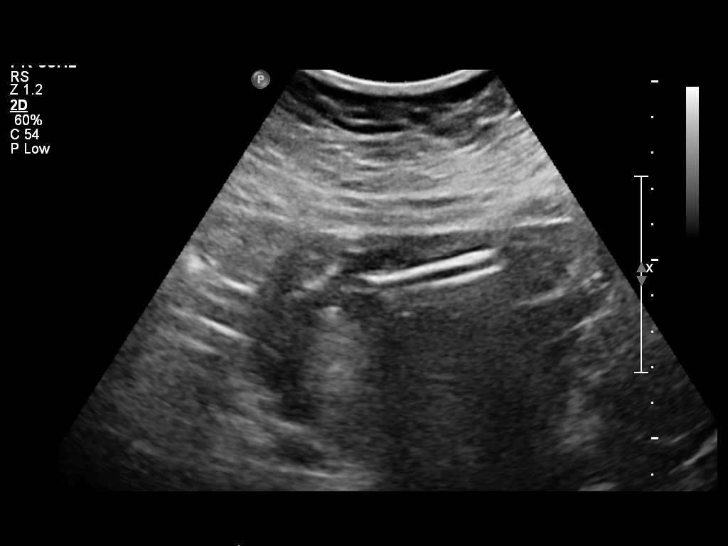
[im 41/65]
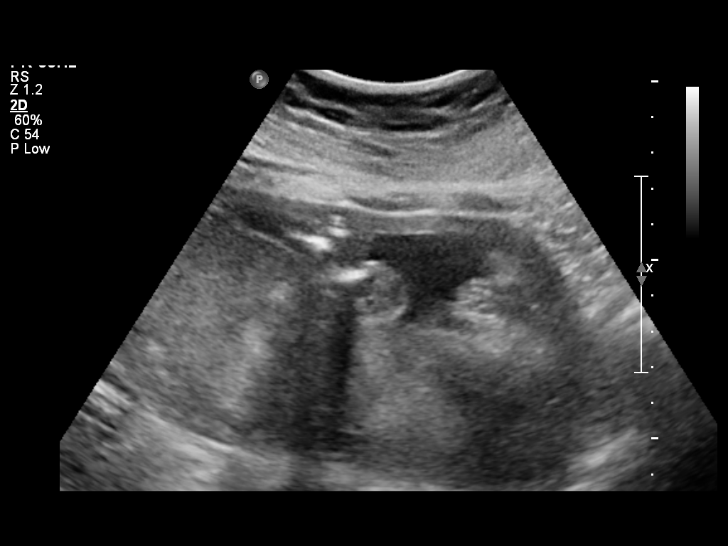
[im 46/65]
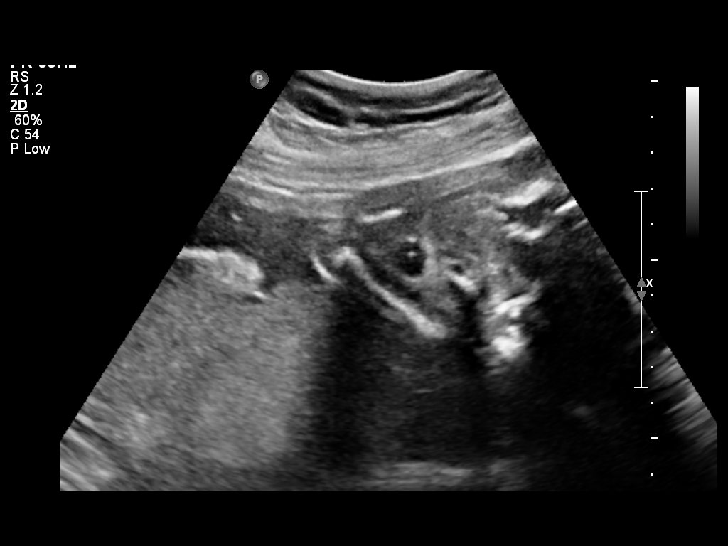
[im 53/65]
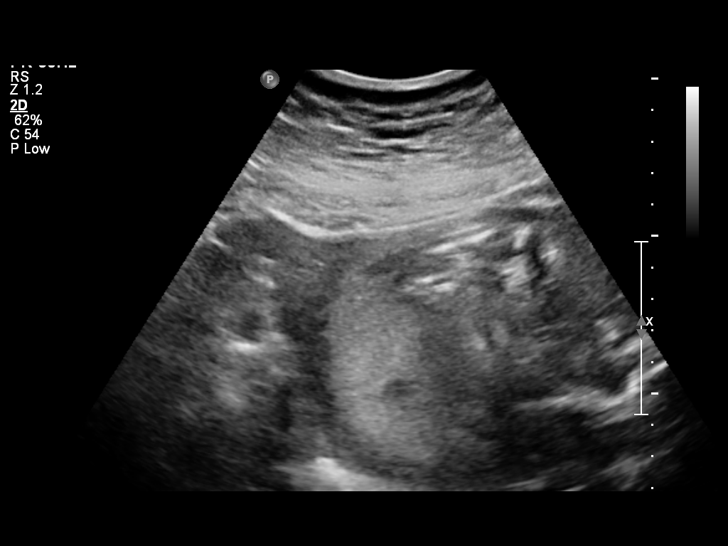
[im 57/65]
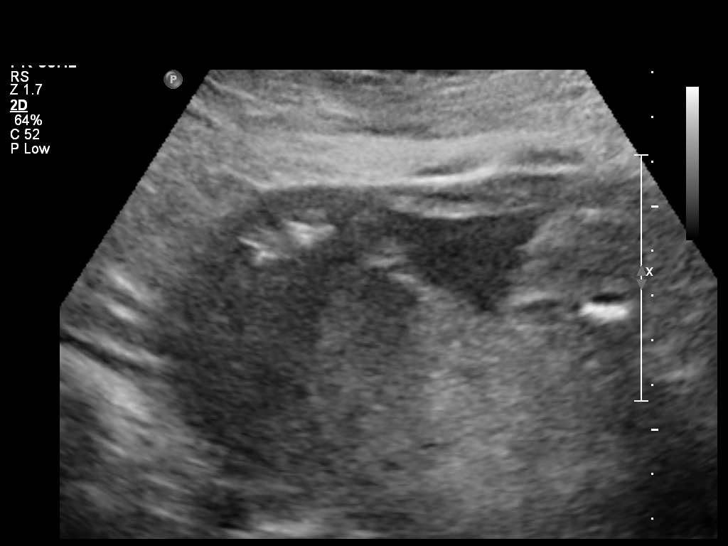
[im 62/65]
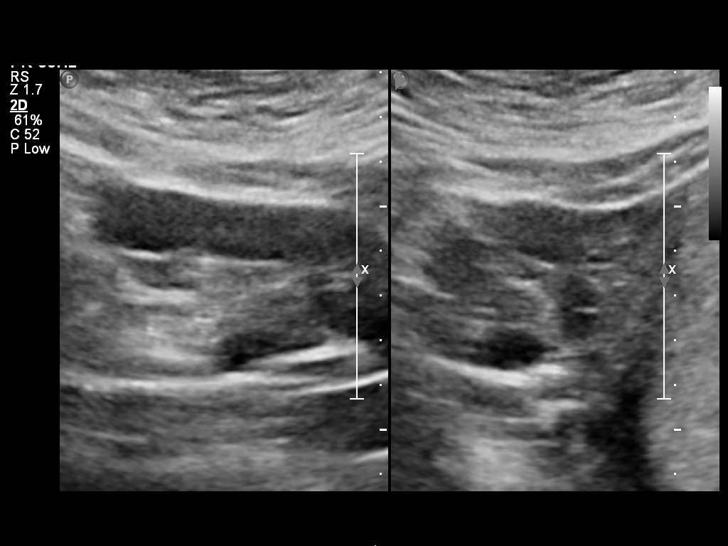

[12 of 28 positions shown; findings below may reference images not displayed]

OBSTETRICS REPORT
                      (Signed Final 01/16/2015 [DATE])

Service(s) Provided

 US OB COMP + 14 WK                                    76805.1
Indications

 Premature rupture of membranes - leaking fluid
 (positive Amnisure)
 23 weeks gestation of pregnancy
Fetal Evaluation

 Num Of Fetuses:    1
 Fetal Heart Rate:  147                          bpm
 Cardiac Activity:  Observed
 Presentation:      Cephalic
 Placenta:          Posterior, above cervical
                    os
 P. Cord            Not well visualized
 Insertion:

 Amniotic Fluid
 AFI FV:      Oligohydramnios
 AFI Sum:     4.49     cm     < 3  %Tile      Larg Pckt:   2.14  cm
 RUQ:   2.14    cm   RLQ:    1.37   cm    LLQ:    0.98   cm
Biometry

 BPD:     59.8   mm    G. Age:  24w 3d                CI:        76.38    70 - 86
                                                      FL/HC:       19.5   18.7 -

 HC:     216.8   mm    G. Age:  23w 5d       40   %   HC/AC:       1.16   1.05 -

 AC:     187.5   mm    G. Age:  23w 4d       40   %   FL/BPD:      70.7   71 - 87
 FL:      42.3   mm    G. Age:  23w 6d       46   %   FL/AC:       22.6   20 - 24
 HUM:     39.3   mm    G. Age:  24w 0d       52   %

 Est. FW:     619   gm     1 lb 6 oz     54  %
Gestational Age

 LMP:           23w 4d        Date:  08/04/14                 EDD:    05/11/15
 U/S Today:     23w 6d                                        EDD:    05/09/15
 Best:          23w 4d     Det. By:  LMP  (08/04/14)          EDD:    05/11/15
Anatomy
 Cranium:          Appears normal         Ductal Arch:       Not well visualized
 Fetal Cavum:      Not well visualized    Diaphragm:         Appears normal
 Ventricles:       Appears normal         Stomach:           Appears normal, left
                                                             sided
 Choroid Plexus:   Appears normal         Abdomen:           Appears normal
 Cerebellum:       Not well visualized    Abdominal Wall:    Not well visualized
 Posterior Fossa:  Not well visualized    Cord Vessels:      Appears normal (3
                                                             vessel cord)
 Face:             Not well visualized    Kidneys:           Appear normal
 Lips:             Not well visualized    Bladder:           Appears normal
 Heart:            Appears normal         Spine:             Not well visualized
                   (4CH, axis, and
                   situs)
 RVOT:             Not well visualized    Lower              Visualized
                                          Extremities:
 LVOT:             Appears normal         Upper              Visualized
                                          Extremities:
 Aortic Arch:      Not well visualized

 Other:  Fetus appears to be a female.  Technically difficult due to decreased
         amniotid fluid volume and fetal position.
Cervix Uterus Adnexa

 Cervical Length:    3.3       cm

 Cervix:       Normal appearance by transabdominal scan.
 Uterus:       No abnormality visualized.
 Cul De Sac:   No free fluid seen.
 Left Ovary:    Not visualized.
 Right Ovary:   Within normal limits.

 Adnexa:     No abnormality visualized.
Comments

 The patient's fetal anatomic survey is not complete.
 However, no gross fetal anomalies were identified.
Impression

 Single living intrauterine pregnancy at 23 weeks 4 days.
 Appropriate fetal growth (54%).
 Normal amniotic fluid volume.
 No gross fetal anomalies identified.
Recommendations

 Follow-up ultrasounds as clinically indicated.

                Andrian Ii, Jang Myun

## 2017-03-15 NOTE — Telephone Encounter (Signed)
Opened in error

## 2018-12-12 ENCOUNTER — Other Ambulatory Visit: Payer: Self-pay

## 2018-12-12 ENCOUNTER — Encounter (HOSPITAL_BASED_OUTPATIENT_CLINIC_OR_DEPARTMENT_OTHER): Payer: Self-pay | Admitting: *Deleted

## 2018-12-12 NOTE — Progress Notes (Signed)
Spoke with Erika Manning after midnight, arrive 950 am 12-18-18 [facility] Needs urine poct Requested surgery orders with Meridee Driver jarrod spouse

## 2018-12-18 ENCOUNTER — Ambulatory Visit (HOSPITAL_BASED_OUTPATIENT_CLINIC_OR_DEPARTMENT_OTHER)
Admission: RE | Admit: 2018-12-18 | Payer: Managed Care, Other (non HMO) | Source: Home / Self Care | Admitting: Obstetrics and Gynecology

## 2018-12-18 HISTORY — DX: Incompetence of cervix uteri: N88.3

## 2018-12-18 SURGERY — CERCLAGE, CERVIX, LAPAROSCOPIC
Anesthesia: General

## 2020-01-02 ENCOUNTER — Ambulatory Visit: Payer: Managed Care, Other (non HMO)

## 2020-01-02 ENCOUNTER — Ambulatory Visit: Payer: Managed Care, Other (non HMO) | Attending: Internal Medicine

## 2020-01-02 DIAGNOSIS — Z23 Encounter for immunization: Secondary | ICD-10-CM

## 2020-01-02 NOTE — Progress Notes (Signed)
   Covid-19 Vaccination Clinic  Name:  Arieliz Latino    MRN: [id number] DOB: March 19, 1981  01/02/2020  Ms. Ellingsen was observed post Covid-19 immunization for 15 minutes without incident. She was provided with Vaccine Information Sheet and instruction to access the V-Safe system.   Ms. Fulgham was instructed to call 911 with any severe reactions post vaccine: Marland Kitchen Difficulty breathing  . Swelling of face and throat  . A fast heartbeat  . A bad rash all over body  . Dizziness and weakness   Immunizations Administered    Name Date Dose VIS Date Route   Pfizer COVID-19 Vaccine 01/02/2020  8:45 AM 0.3 mL 09/19/2019 Intramuscular   Manufacturer: [employer], [employer]   Lot: [id number]   NDC: [address]-2

## 2020-01-27 ENCOUNTER — Ambulatory Visit: Payer: Managed Care, Other (non HMO) | Attending: Internal Medicine

## 2020-01-27 DIAGNOSIS — Z23 Encounter for immunization: Secondary | ICD-10-CM

## 2020-01-27 NOTE — Progress Notes (Signed)
   Covid-19 Vaccination Clinic  Name:  Elianys Conry    MRN: [id number] DOB: 02/12/1981  01/27/2020  Ms. Nitsch was observed post Covid-19 immunization for 15 minutes without incident. She was provided with Vaccine Information Sheet and instruction to access the V-Safe system.   Ms. Mehlhoff was instructed to call 911 with any severe reactions post vaccine: Marland Kitchen Difficulty breathing  . Swelling of face and throat  . A fast heartbeat  . A bad rash all over body  . Dizziness and weakness   Immunizations Administered    Name Date Dose VIS Date Route   Pfizer COVID-19 Vaccine 01/27/2020  8:39 AM 0.3 mL 12/03/2018 Intramuscular   Manufacturer: [employer], [employer]   Lot: [id number]   NDC: [address]-4

## 2020-12-08 ENCOUNTER — Ambulatory Visit: Payer: Self-pay | Attending: Internal Medicine

## 2020-12-08 DIAGNOSIS — Z23 Encounter for immunization: Secondary | ICD-10-CM

## 2020-12-08 NOTE — Progress Notes (Signed)
   Covid-19 Vaccination Clinic  Name:  Erika Manning    MRN: [id number] DOB: 29-Jun-1981  12/08/2020  [name] was observed post Covid-19 immunization for 15 minutes without incident. She was provided with Vaccine Information Sheet and instruction to access the V-Safe system.   [name] was instructed to call 911 with any severe reactions post vaccine: Marland Kitchen Difficulty breathing  . Swelling of face and throat  . A fast heartbeat  . A bad rash all over body  . Dizziness and weakness   Immunizations Administered    Name Date Dose VIS Date Route   PFIZER Comrnaty(Gray TOP) Covid-19 Vaccine 12/08/2020  3:07 PM 0.3 mL 09/16/2020 Intramuscular   Manufacturer: [employer], [employer]   Lot: [id number]   NDC: [id number]

## 2022-09-27 DIAGNOSIS — N915 Oligomenorrhea, unspecified: Secondary | ICD-10-CM | POA: Diagnosis not present

## 2022-09-27 DIAGNOSIS — Z1151 Encounter for screening for human papillomavirus (HPV): Secondary | ICD-10-CM | POA: Diagnosis not present

## 2022-09-27 DIAGNOSIS — Z6836 Body mass index (BMI) 36.0-36.9, adult: Secondary | ICD-10-CM | POA: Diagnosis not present

## 2022-09-27 DIAGNOSIS — Z124 Encounter for screening for malignant neoplasm of cervix: Secondary | ICD-10-CM | POA: Diagnosis not present

## 2022-09-27 DIAGNOSIS — Z01419 Encounter for gynecological examination (general) (routine) without abnormal findings: Secondary | ICD-10-CM | POA: Diagnosis not present

## 2022-10-19 DIAGNOSIS — Z1231 Encounter for screening mammogram for malignant neoplasm of breast: Secondary | ICD-10-CM | POA: Diagnosis not present

## 2022-10-31 DIAGNOSIS — E1165 Type 2 diabetes mellitus with hyperglycemia: Secondary | ICD-10-CM | POA: Diagnosis not present

## 2022-11-01 DIAGNOSIS — E1165 Type 2 diabetes mellitus with hyperglycemia: Secondary | ICD-10-CM | POA: Diagnosis not present

## 2022-11-07 DIAGNOSIS — E1165 Type 2 diabetes mellitus with hyperglycemia: Secondary | ICD-10-CM | POA: Diagnosis not present

## 2022-11-22 DIAGNOSIS — E2839 Other primary ovarian failure: Secondary | ICD-10-CM | POA: Diagnosis not present

## 2022-11-22 DIAGNOSIS — N979 Female infertility, unspecified: Secondary | ICD-10-CM | POA: Diagnosis not present

## 2022-11-22 DIAGNOSIS — Z319 Encounter for procreative management, unspecified: Secondary | ICD-10-CM | POA: Diagnosis not present

## 2022-12-11 DIAGNOSIS — Z3141 Encounter for fertility testing: Secondary | ICD-10-CM | POA: Diagnosis not present

## 2022-12-11 DIAGNOSIS — N979 Female infertility, unspecified: Secondary | ICD-10-CM | POA: Diagnosis not present

## 2022-12-12 DIAGNOSIS — E2839 Other primary ovarian failure: Secondary | ICD-10-CM | POA: Diagnosis not present

## 2022-12-12 DIAGNOSIS — Z3141 Encounter for fertility testing: Secondary | ICD-10-CM | POA: Diagnosis not present

## 2022-12-12 DIAGNOSIS — N979 Female infertility, unspecified: Secondary | ICD-10-CM | POA: Diagnosis not present

## 2022-12-19 DIAGNOSIS — E1165 Type 2 diabetes mellitus with hyperglycemia: Secondary | ICD-10-CM | POA: Diagnosis not present

## 2023-01-03 DIAGNOSIS — N840 Polyp of corpus uteri: Secondary | ICD-10-CM | POA: Diagnosis not present

## 2023-01-03 DIAGNOSIS — N736 Female pelvic peritoneal adhesions (postinfective): Secondary | ICD-10-CM | POA: Diagnosis not present

## 2023-02-19 DIAGNOSIS — Z3141 Encounter for fertility testing: Secondary | ICD-10-CM | POA: Diagnosis not present

## 2023-02-19 DIAGNOSIS — N856 Intrauterine synechiae: Secondary | ICD-10-CM | POA: Diagnosis not present

## 2023-03-23 DIAGNOSIS — E1165 Type 2 diabetes mellitus with hyperglycemia: Secondary | ICD-10-CM | POA: Diagnosis not present

## 2023-06-27 DIAGNOSIS — N912 Amenorrhea, unspecified: Secondary | ICD-10-CM | POA: Diagnosis not present

## 2023-06-27 DIAGNOSIS — N898 Other specified noninflammatory disorders of vagina: Secondary | ICD-10-CM | POA: Diagnosis not present

## 2023-06-27 DIAGNOSIS — N760 Acute vaginitis: Secondary | ICD-10-CM | POA: Diagnosis not present

## 2023-09-17 DIAGNOSIS — E1165 Type 2 diabetes mellitus with hyperglycemia: Secondary | ICD-10-CM | POA: Diagnosis not present

## 2023-10-01 DIAGNOSIS — Z01419 Encounter for gynecological examination (general) (routine) without abnormal findings: Secondary | ICD-10-CM | POA: Diagnosis not present

## 2023-10-01 DIAGNOSIS — Z6830 Body mass index (BMI) 30.0-30.9, adult: Secondary | ICD-10-CM | POA: Diagnosis not present

## 2023-11-01 DIAGNOSIS — Z1231 Encounter for screening mammogram for malignant neoplasm of breast: Secondary | ICD-10-CM | POA: Diagnosis not present

## 2024-03-17 DIAGNOSIS — E1165 Type 2 diabetes mellitus with hyperglycemia: Secondary | ICD-10-CM | POA: Diagnosis not present

## 2024-03-24 DIAGNOSIS — E1165 Type 2 diabetes mellitus with hyperglycemia: Secondary | ICD-10-CM | POA: Diagnosis not present
